# Patient Record
Sex: Male | Born: 1975 | Race: Black or African American | Hispanic: No | Marital: Married | Smoking: Never smoker
Health system: Southern US, Community
[De-identification: ages and names within clinical notes are randomized; demographics above are authoritative.]

## PROBLEM LIST (undated history)

## (undated) DIAGNOSIS — C189 Malignant neoplasm of colon, unspecified: Secondary | ICD-10-CM

## (undated) DIAGNOSIS — E119 Type 2 diabetes mellitus without complications: Secondary | ICD-10-CM

## (undated) DIAGNOSIS — R7303 Prediabetes: Secondary | ICD-10-CM

## (undated) HISTORY — PX: HERNIA REPAIR: SHX51

## (undated) HISTORY — DX: Type 2 diabetes mellitus without complications: E11.9

## (undated) HISTORY — PX: COLON SURGERY: SHX602

---

## 2008-05-20 ENCOUNTER — Encounter: Admission: RE | Admit: 2008-05-20 | Discharge: 2008-06-20 | Payer: Self-pay | Admitting: Orthopaedic Surgery

## 2008-08-04 ENCOUNTER — Emergency Department (HOSPITAL_COMMUNITY): Admission: EM | Admit: 2008-08-04 | Discharge: 2008-08-04 | Payer: Self-pay | Admitting: Emergency Medicine

## 2011-06-16 ENCOUNTER — Ambulatory Visit
Admission: RE | Admit: 2011-06-16 | Discharge: 2011-06-16 | Disposition: A | Discharge status: Home / Self Care | Payer: BC Managed Care – PPO | Source: Ambulatory Visit | Attending: Internal Medicine | Admitting: Internal Medicine

## 2011-06-16 ENCOUNTER — Other Ambulatory Visit: Payer: Self-pay | Admitting: Internal Medicine

## 2011-06-16 DIAGNOSIS — R52 Pain, unspecified: Secondary | ICD-10-CM

## 2011-06-21 ENCOUNTER — Other Ambulatory Visit: Payer: Self-pay | Admitting: Internal Medicine

## 2011-06-21 DIAGNOSIS — R52 Pain, unspecified: Secondary | ICD-10-CM

## 2011-06-24 ENCOUNTER — Ambulatory Visit
Admission: RE | Admit: 2011-06-24 | Discharge: 2011-06-24 | Disposition: A | Discharge status: Home / Self Care | Payer: BC Managed Care – PPO | Source: Ambulatory Visit | Attending: Internal Medicine | Admitting: Internal Medicine

## 2011-06-24 DIAGNOSIS — R52 Pain, unspecified: Secondary | ICD-10-CM

## 2013-04-28 IMAGING — CR DG ANKLE COMPLETE 3+V*L*
3 series · 3 of 3 positions shown · non-contrast
Comparison: None.

CLINICAL DATA: Medial left ankle pain for 3-4 weeks, no injury

LEFT ANKLE COMPLETE - 3+ VIEW

[t ankle joint ap left]
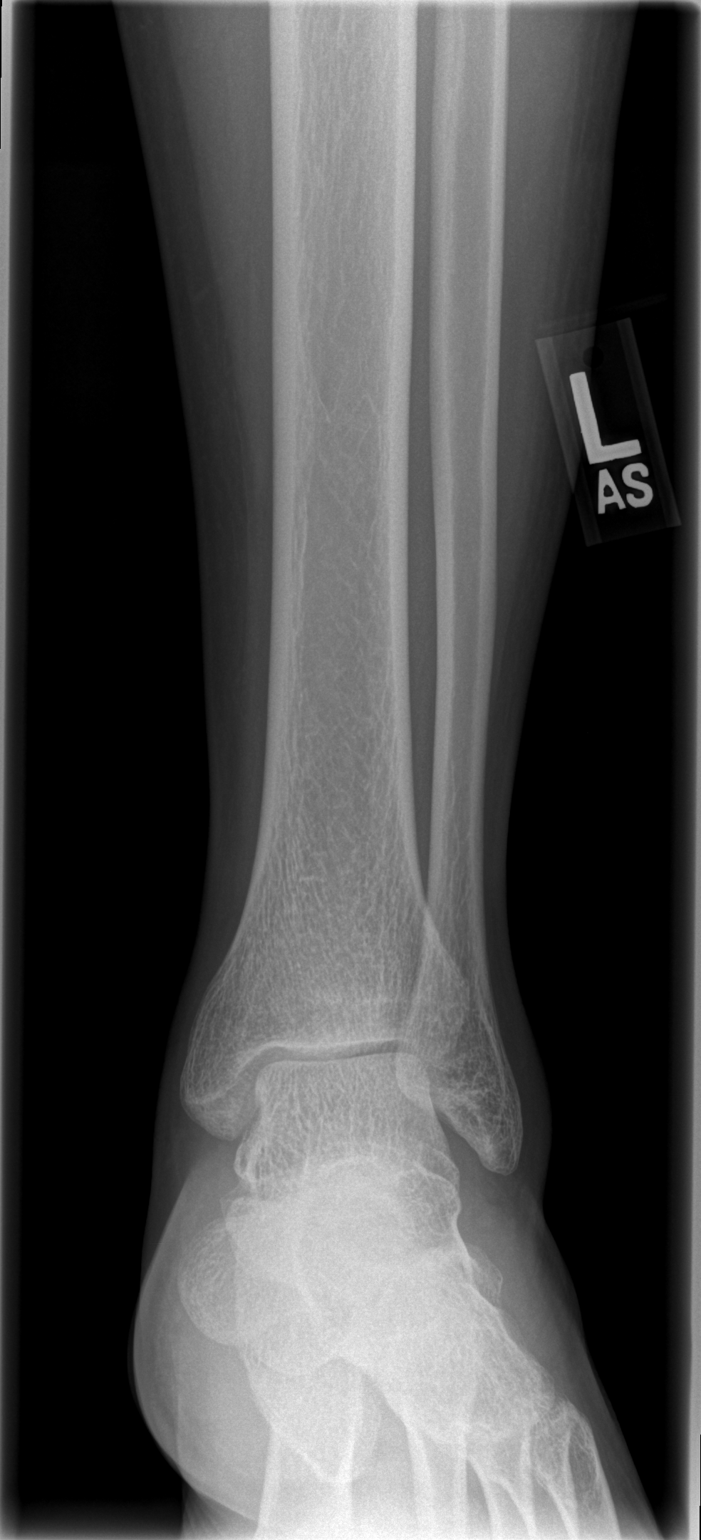

[t ankle joint oblique left]
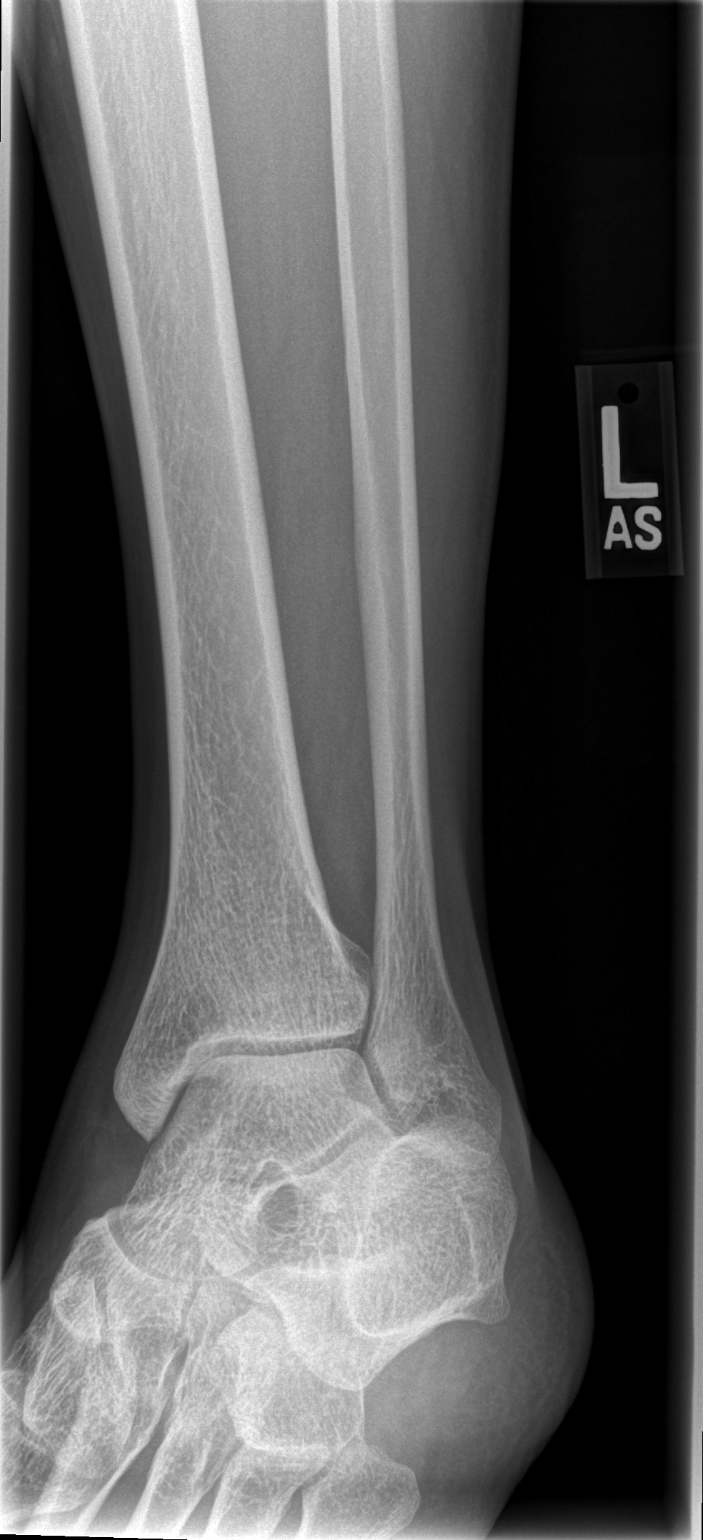

[t ankle joint lat left]
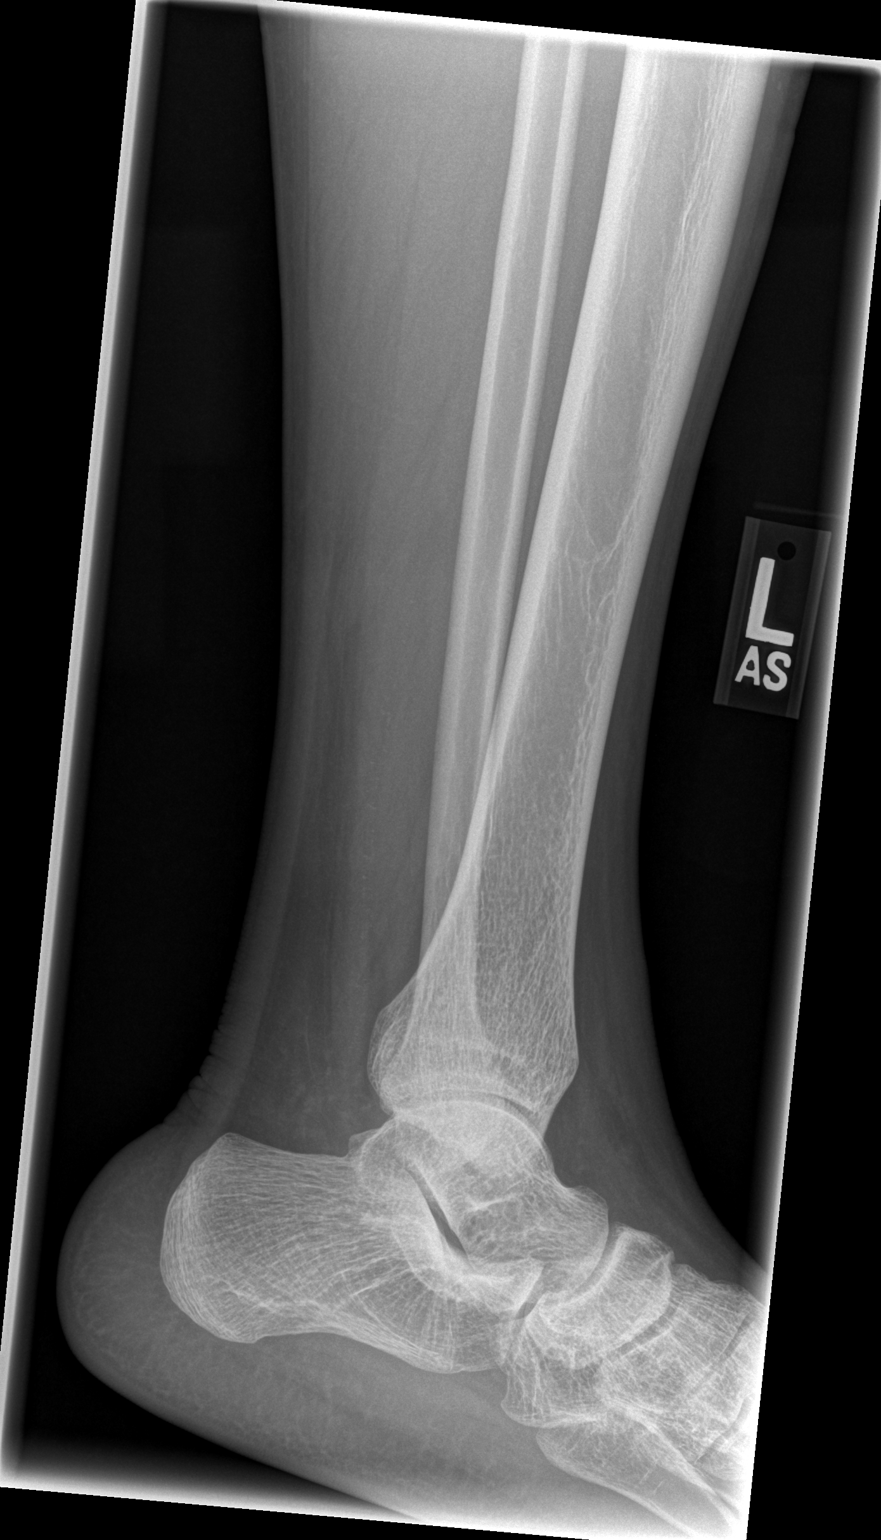

[3 of 3 positions shown; findings below may reference images not displayed]

FINDINGS: No acute abnormality is seen.  The ankle joint appears
normal.  Alignment is normal.
IMPRESSION: Negative.

## 2018-10-20 ENCOUNTER — Other Ambulatory Visit: Payer: Self-pay

## 2018-10-20 DIAGNOSIS — Z20822 Contact with and (suspected) exposure to covid-19: Secondary | ICD-10-CM

## 2018-10-21 LAB — NOVEL CORONAVIRUS, NAA: SARS-CoV-2, NAA: DETECTED — AB

## 2020-09-25 ENCOUNTER — Other Ambulatory Visit: Payer: Self-pay | Admitting: Gastroenterology

## 2020-09-25 ENCOUNTER — Emergency Department (HOSPITAL_COMMUNITY)
Admission: EM | Admit: 2020-09-25 | Discharge: 2020-09-26 | Disposition: A | Discharge status: Home / Self Care | Payer: BC Managed Care – PPO | Attending: Emergency Medicine | Admitting: Emergency Medicine

## 2020-09-25 ENCOUNTER — Other Ambulatory Visit (HOSPITAL_COMMUNITY): Payer: Self-pay | Admitting: Gastroenterology

## 2020-09-25 ENCOUNTER — Encounter (HOSPITAL_COMMUNITY): Payer: Self-pay

## 2020-09-25 ENCOUNTER — Ambulatory Visit (HOSPITAL_COMMUNITY): Payer: Self-pay

## 2020-09-25 ENCOUNTER — Emergency Department (HOSPITAL_COMMUNITY): Payer: BC Managed Care – PPO

## 2020-09-25 ENCOUNTER — Other Ambulatory Visit (HOSPITAL_BASED_OUTPATIENT_CLINIC_OR_DEPARTMENT_OTHER): Payer: Self-pay | Admitting: Gastroenterology

## 2020-09-25 ENCOUNTER — Other Ambulatory Visit: Payer: Self-pay

## 2020-09-25 DIAGNOSIS — K625 Hemorrhage of anus and rectum: Secondary | ICD-10-CM | POA: Diagnosis not present

## 2020-09-25 DIAGNOSIS — C187 Malignant neoplasm of sigmoid colon: Secondary | ICD-10-CM

## 2020-09-25 DIAGNOSIS — Z5321 Procedure and treatment not carried out due to patient leaving prior to being seen by health care provider: Secondary | ICD-10-CM | POA: Diagnosis not present

## 2020-09-25 DIAGNOSIS — R799 Abnormal finding of blood chemistry, unspecified: Secondary | ICD-10-CM | POA: Diagnosis not present

## 2020-09-25 DIAGNOSIS — C188 Malignant neoplasm of overlapping sites of colon: Secondary | ICD-10-CM | POA: Insufficient documentation

## 2020-09-25 LAB — COMPREHENSIVE METABOLIC PANEL
ALT: 22 U/L (ref 0–44)
AST: 20 U/L (ref 15–41)
Albumin: 4.8 g/dL (ref 3.5–5.0)
Alkaline Phosphatase: 60 U/L (ref 38–126)
Anion gap: 9 (ref 5–15)
BUN: 10 mg/dL (ref 6–20)
CO2: 26 mmol/L (ref 22–32)
Calcium: 9.3 mg/dL (ref 8.9–10.3)
Chloride: 102 mmol/L (ref 98–111)
Creatinine, Ser: 0.87 mg/dL (ref 0.61–1.24)
GFR, Estimated: >60 mL/min (ref >60)
Glucose, Bld: 91 mg/dL (ref 70–99)
Potassium: 3.7 mmol/L (ref 3.5–5.1)
Sodium: 137 mmol/L (ref 135–145)
Total Bilirubin: 0.8 mg/dL (ref 0.3–1.2)
Total Protein: 7.4 g/dL (ref 6.5–8.1)

## 2020-09-25 LAB — CBC WITH DIFFERENTIAL/PLATELET
Abs Immature Granulocytes: 0.02 10*3/uL (ref 0.00–0.07)
Basophils Absolute: 0 10*3/uL (ref 0.0–0.1)
Basophils Relative: 0 %
Eosinophils Absolute: 0 10*3/uL (ref 0.0–0.5)
Eosinophils Relative: 0 %
HCT: 42.8 % (ref 39.0–52.0)
Hemoglobin: 13.4 g/dL (ref 13.0–17.0)
Immature Granulocytes: 0 %
Lymphocytes Relative: 26 %
Lymphs Abs: 2.1 10*3/uL (ref 0.7–4.0)
MCH: 20.8 pg — ABNORMAL LOW (ref 26.0–34.0)
MCHC: 31.3 g/dL (ref 30.0–36.0)
MCV: 66.6 fL — ABNORMAL LOW (ref 80.0–100.0)
Monocytes Absolute: 0.6 10*3/uL (ref 0.1–1.0)
Monocytes Relative: 8 %
Neutro Abs: 5.3 10*3/uL (ref 1.7–7.7)
Neutrophils Relative %: 66 %
Platelets: 225 10*3/uL (ref 150–400)
RBC: 6.43 MIL/uL — ABNORMAL HIGH (ref 4.22–5.81)
RDW: 15.9 % — ABNORMAL HIGH (ref 11.5–15.5)
WBC: 8.1 10*3/uL (ref 4.0–10.5)
nRBC: 0 % (ref 0.0–0.2)

## 2020-09-25 MED ORDER — IOHEXOL 350 MG/ML SOLN
80.0000 mL | Freq: Once | INTRAVENOUS | Status: AC | PRN
  Administered 2020-09-25: 80 mL via INTRAVENOUS

## 2020-09-25 NOTE — ED Provider Notes (Signed)
Emergency Medicine Provider Triage Evaluation Note  Clifford [name] , a 45 y.o. male  was evaluated in triage.  Pt complains of bright red blood per rectum that began few weeks ago.  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  Presents here for CT chest abdomen and pelvis.  Review of Systems  Positive:  Negative: Weakness, numbness, fatigue, chest pain, shortness of breath  Physical Exam  BP (!) 152/101 (BP Location: Left Arm)   Pulse 79   Temp 98.5 F (36.9 C) (Oral)   Resp 18   Ht '5\' 4"'$  (1.626 m)   Wt 90.7 kg   SpO2 99%   BMI 34.33 kg/m  Gen:   Awake, no distress   Resp:  Normal effort  MSK:   Moves extremities without difficulty  Other:    Medical Decision Making  Medically screening exam initiated at 6:17 PM.  Appropriate orders placed.  Clifford [name] was informed that the remainder of the evaluation will be completed by another provider, this initial triage assessment does not replace that evaluation, and the importance of remaining in the ED until their evaluation is complete.     Myna Bright [place], PA-C 09/25/20 1819    Dorie Rank, MD 09/27/20 [phone]

## 2020-09-25 NOTE — ED Triage Notes (Signed)
Pt referred by doctor hong for CT Abd after colonoscopy today d/t suspicious colon cancer

## 2020-09-25 NOTE — ED Notes (Signed)
Pt ambulatory in ED lobby. 

## 2020-09-26 ENCOUNTER — Ambulatory Visit (HOSPITAL_COMMUNITY): Admission: RE | Admit: 2020-09-26 | Payer: BC Managed Care – PPO | Source: Ambulatory Visit

## 2020-09-26 NOTE — ED Notes (Signed)
Pt request IV be removed to leave.

## 2020-10-14 ENCOUNTER — Encounter: Payer: Self-pay | Admitting: Surgery

## 2020-10-14 DIAGNOSIS — C187 Malignant neoplasm of sigmoid colon: Principal | ICD-10-CM | POA: Diagnosis present

## 2020-10-14 DIAGNOSIS — K921 Melena: Secondary | ICD-10-CM | POA: Insufficient documentation

## 2020-10-14 DIAGNOSIS — R142 Eructation: Secondary | ICD-10-CM | POA: Insufficient documentation

## 2020-10-14 DIAGNOSIS — R141 Gas pain: Secondary | ICD-10-CM | POA: Insufficient documentation

## 2020-10-15 NOTE — Progress Notes (Signed)
Please place orders in epic pt. Has preop scheduled

## 2020-10-16 ENCOUNTER — Ambulatory Visit: Payer: Self-pay | Admitting: Surgery

## 2020-10-17 NOTE — Progress Notes (Signed)
PCP -  Cardiologist -   PPM/ICD -  Device Orders -  Rep Notified -   Chest x-ray - CT Chest 09-25-20 epic EKG -  Stress Test -  ECHO -  Cardiac Cath -  CBC/diff, CMP 09-25-20 epic  Sleep Study -  CPAP -   Fasting Blood Sugar -  Checks Blood Sugar _____ times a day  Blood Thinner Instructions: Aspirin Instructions:  ERAS Protcol - PRE-SURGERY Ensure or G2-   COVID TEST- 10-22-20 COVID vaccine -  Activity-- Anesthesia review: DM  Patient denies shortness of breath, fever, cough and chest pain at PAT appointment   All instructions explained to the patient, with a verbal understanding of the material. Patient agrees to go over the instructions while at home for a better understanding. Patient also instructed to self quarantine after being tested for COVID-19. The opportunity to ask questions was provided.

## 2020-10-17 NOTE — Patient Instructions (Addendum)
DUE TO COVID-19 ONLY ONE VISITOR IS ALLOWED TO COME WITH YOU AND STAY IN THE WAITING ROOM ONLY DURING PRE OP AND PROCEDURE.   **NO VISITORS ARE ALLOWED IN THE SHORT STAY AREA OR RECOVERY ROOM!!**  IF YOU WILL BE ADMITTED INTO THE HOSPITAL YOU ARE ALLOWED ONLY TWO SUPPORT PEOPLE DURING VISITATION HOURS ONLY (10AM -8PM)   The support person(s) may change daily. The support person(s) must pass our screening, gel in and out, and wear a mask at all times, including in the patient's room. Patients must also wear a mask when staff or their support person are in the room.  No visitors under the age of 64. Any visitor under the age of 42 must be accompanied by an adult.   COVID SWAB TESTING MUST BE COMPLETED ON:  10-22-20, Between the hours of 8 and 3  **MUST PRESENT COMPLETED FORM AT TESTING SITE**    [address] [place] [place] (backside of the building)  You are not required to quarantine, however you are required to wear a well-fitted mask when you are out and around people not in your household.  Hand Hygiene often Do NOT share personal items Notify your provider if you are in close contact with someone who has COVID or you develop fever 100.4 or greater, new onset of sneezing, cough, sore throat, shortness of breath or body aches.   Your procedure is scheduled on:  Friday, 10-24-20   Report to [facility] Main  Entrance    Report to admitting at 11:45 AM   Call this number if you have problems the morning of surgery [phone]   Follow a clear liquid diet day or prep to prevent dehydration   Do not eat food :After Midnight.   May have liquids until 11:00 AM day of surgery  CLEAR LIQUID DIET  Foods Allowed                                                                     Foods Excluded  Water, Black Coffee (no milk/no creamer) and tea, regular and decaf                              liquids that you cannot  Plain Jell-O in any flavor  (No red)                          see through such as: Fruit ices (not with fruit pulp)                                 milk, soups, orange juice  Iced Popsicles (No red)                                    All solid food                             Apple juices Sports drinks like Gatorade (No red) Lightly seasoned clear broth or consume(fat free)  Sugar Sample Menu Breakfast                                Lunch                                     Supper Cranberry juice                    Beef broth                            Chicken broth Jell-O                                     Grape juice                           Apple juice Coffee or tea                        Jell-O                                      Popsicle                                                Coffee or tea                        Coffee or tea       Drink 2 G2 drinks the night before surgery, complete by 10 PM.    Complete one G2 drink the morning of surgery 3 hours prior to scheduled surgery, complete by 11 AM.     The day of surgery:  Drink ONE (1)  G2 by am the morning of surgery. Drink in one sitting. Do not sip.  This drink was given to you during your hospital  pre-op appointment visit. Nothing else to drink after completing the  G2.          If you have questions, please contact your surgeon's office.     Oral Hygiene is also important to reduce your risk of infection.                                    Remember - BRUSH YOUR TEETH THE MORNING OF SURGERY WITH YOUR REGULAR TOOTHPASTE   Do NOT smoke after Midnight   Take these medicines the morning of surgery with A SIP OF WATER:  None  DO NOT TAKE ANY ORAL DIABETIC MEDICATIONS DAY OF YOUR SURGERY   Stop all vitamins and herbal supplements a week before surgery             You may not have any metal on your body including jewelry, and body piercing             Do not wear  lotions, powders, cologne, or deodorant  Men may shave face and neck.  Do not bring  valuables to the hospital. [place].   Contacts, dentures or bridgework may not be worn into surgery.   Bring small overnight bag day of surgery.   Special Instructions: Bring a copy of your healthcare power of attorney and living will documents the day of surgery if you haven't scanned them in before.  Please read over the following fact sheets you were given: IF YOU HAVE QUESTIONS ABOUT YOUR PRE OP INSTRUCTIONS PLEASE CALL [phone]           [facility] - Preparing for Surgery Before surgery, you can play an important role.  Because skin is not sterile, your skin needs to be as free of germs as possible.  You can reduce the number of germs on your skin by washing with CHG (chlorahexidine gluconate) soap before surgery.  CHG is an antiseptic cleaner which kills germs and bonds with the skin to continue killing germs even after washing. Please DO NOT use if you have an allergy to CHG or antibacterial soaps.  If your skin becomes reddened/irritated stop using the CHG and inform your nurse when you arrive at Short Stay. Do not shave (including legs and underarms) for at least 48 hours prior to the first CHG shower.  You may shave your face/neck. Please follow these instructions carefully:  1.  Shower with CHG Soap the night before surgery and the  morning of Surgery.  2.  If you choose to wash your hair, wash your hair first as usual with your  normal  shampoo.  3.  After you shampoo, rinse your hair and body thoroughly to remove the  shampoo.                           4.  Use CHG as you would any other liquid soap.  You can apply chg directly  to the skin and wash                       Gently with a scrungie or clean washcloth.  5.  Apply the CHG Soap to your body ONLY FROM THE NECK DOWN.   Do not use on face/ open                           Wound or open sores. Avoid contact with eyes, ears mouth and genitals (private parts).                       Wash  face,  Genitals (private parts) with your normal soap.             6.  Wash thoroughly, paying special attention to the area where your surgery  will be performed.  7.  Thoroughly rinse your body with warm water from the neck down.  8.  DO NOT shower/wash with your normal soap after using and rinsing off  the CHG Soap.                9.  Pat yourself dry with a clean towel.            10.  Wear clean pajamas.            11.  Place clean sheets on your bed the night of your first shower and do not  sleep with pets. Day of Surgery : Do not apply any lotions/deodorants the morning of surgery.  Please wear clean clothes to the hospital/surgery center.  FAILURE TO FOLLOW THESE INSTRUCTIONS MAY RESULT IN THE CANCELLATION OF YOUR SURGERY PATIENT SIGNATURE_________________________________  NURSE SIGNATURE__________________________________  ________________________________________________________________________    Adam Phenix  An incentive spirometer is a tool that can help keep your lungs clear and active. This tool measures how well you are filling your lungs with each breath. Taking long deep breaths may help reverse or decrease the chance of developing breathing (pulmonary) problems (especially infection) following: A long period of time when you are unable to move or be active. BEFORE THE PROCEDURE  If the spirometer includes an indicator to show your best effort, your nurse or respiratory therapist will set it to a desired goal. If possible, sit up straight or lean slightly forward. Try not to slouch. Hold the incentive spirometer in an upright position. INSTRUCTIONS FOR USE  Sit on the edge of your bed if possible, or sit up as far as you can in bed or on a chair. Hold the incentive spirometer in an upright position. Breathe out normally. Place the mouthpiece in your mouth and seal your lips tightly around it. Breathe in slowly and as deeply as possible, raising the piston or  the ball toward the top of the column. Hold your breath for 3-5 seconds or for as long as possible. Allow the piston or ball to fall to the bottom of the column. Remove the mouthpiece from your mouth and breathe out normally. Rest for a few seconds and repeat Steps 1 through 7 at least 10 times every 1-2 hours when you are awake. Take your time and take a few normal breaths between deep breaths. The spirometer may include an indicator to show your best effort. Use the indicator as a goal to work toward during each repetition. After each set of 10 deep breaths, practice coughing to be sure your lungs are clear. If you have an incision (the cut made at the time of surgery), support your incision when coughing by placing a pillow or rolled up towels firmly against it. Once you are able to get out of bed, walk around indoors and cough well. You may stop using the incentive spirometer when instructed by your caregiver.  RISKS AND COMPLICATIONS Take your time so you do not get dizzy or light-headed. If you are in pain, you may need to take or ask for pain medication before doing incentive spirometry. It is harder to take a deep breath if you are having pain. AFTER USE Rest and breathe slowly and easily. It can be helpful to keep track of a log of your progress. Your caregiver can provide you with a simple table to help with this. If you are using the spirometer at home, follow these instructions: [place] IF:  You are having difficultly using the spirometer. You have trouble using the spirometer as often as instructed. Your pain medication is not giving enough relief while using the spirometer. You develop fever of 100.5 F (38.1 C) or higher. SEEK IMMEDIATE MEDICAL CARE IF:  You cough up bloody sputum that had not been present before. You develop fever of 102 F (38.9 C) or greater. You develop worsening pain at or near the incision site. MAKE SURE YOU:  Understand these  instructions. Will watch your condition. Will get help right away if you are not doing well or get worse. Document Released: 05/17/2006 Document  Revised: 03/29/2011 Document Reviewed: 07/18/2006 Exit[employer] Patient Information 2014 [employer], [place].   ________________________________________________________________________    WHAT IS A BLOOD TRANSFUSION? Blood Transfusion Information  A transfusion is the replacement of blood or some of its parts. Blood is made up of multiple cells which provide different functions. Red blood cells carry oxygen and are used for blood loss replacement. White blood cells fight against infection. Platelets control bleeding. Plasma helps clot blood. Other blood products are available for specialized needs, such as hemophilia or other clotting disorders. BEFORE THE TRANSFUSION  Who gives blood for transfusions?  Healthy volunteers who are fully evaluated to make sure their blood is safe. This is blood bank blood. Transfusion therapy is the safest it has ever been in the practice of medicine. Before blood is taken from a donor, a complete history is taken to make sure that person has no history of diseases nor engages in risky social behavior (examples are intravenous drug use or sexual activity with multiple partners). The donor's travel history is screened to minimize risk of transmitting infections, such as malaria. The donated blood is tested for signs of infectious diseases, such as HIV and hepatitis. The blood is then tested to be sure it is compatible with you in order to minimize the chance of a transfusion reaction. If you or a relative donates blood, this is often done in anticipation of surgery and is not appropriate for emergency situations. It takes many days to process the donated blood. RISKS AND COMPLICATIONS Although transfusion therapy is very safe and saves many lives, the main dangers of transfusion include:  Getting an infectious  disease. Developing a transfusion reaction. This is an allergic reaction to something in the blood you were given. Every precaution is taken to prevent this. The decision to have a blood transfusion has been considered carefully by your caregiver before blood is given. Blood is not given unless the benefits outweigh the risks. AFTER THE TRANSFUSION Right after receiving a blood transfusion, you will usually feel much better and more energetic. This is especially true if your red blood cells have gotten low (anemic). The transfusion raises the level of the red blood cells which carry oxygen, and this usually causes an energy increase. The nurse administering the transfusion will monitor you carefully for complications. HOME CARE INSTRUCTIONS  No special instructions are needed after a transfusion. You may find your energy is better. Speak with your caregiver about any limitations on activity for underlying diseases you may have. SEEK MEDICAL CARE IF:  Your condition is not improving after your transfusion. You develop redness or irritation at the intravenous (IV) site. SEEK IMMEDIATE MEDICAL CARE IF:  Any of the following symptoms occur over the next 12 hours: Shaking chills. You have a temperature by mouth above 102 F (38.9 C), not controlled by medicine. Chest, back, or muscle pain. People around you feel you are not acting correctly or are confused. Shortness of breath or difficulty breathing. Dizziness and fainting. You get a rash or develop hives. You have a decrease in urine output. Your urine turns a dark color or changes to pink, red, or brown. Any of the following symptoms occur over the next 10 days: You have a temperature by mouth above 102 F (38.9 C), not controlled by medicine. Shortness of breath. Weakness after normal activity. The white part of the eye turns yellow (jaundice). You have a decrease in the amount of urine or are urinating less often. Your urine turns a  dark color  or changes to pink, red, or brown. Document Released: 01/02/2000 Document Revised: 03/29/2011 Document Reviewed: 08/21/2007 [facility] Patient Information 2014 [place], [place].  _______________________________________________________________________

## 2020-10-20 ENCOUNTER — Encounter (HOSPITAL_COMMUNITY): Payer: Self-pay

## 2020-10-20 ENCOUNTER — Encounter (HOSPITAL_COMMUNITY)
Admission: RE | Admit: 2020-10-20 | Discharge: 2020-10-20 | Disposition: A | Discharge status: Home / Self Care | Payer: BC Managed Care – PPO | Source: Ambulatory Visit | Attending: Surgery | Admitting: Surgery

## 2020-10-20 ENCOUNTER — Other Ambulatory Visit: Payer: Self-pay

## 2020-10-20 DIAGNOSIS — Z01818 Encounter for other preprocedural examination: Secondary | ICD-10-CM | POA: Diagnosis present

## 2020-10-20 HISTORY — DX: Malignant neoplasm of colon, unspecified: C18.9

## 2020-10-20 HISTORY — DX: Prediabetes: R73.03

## 2020-10-20 LAB — HEMOGLOBIN A1C
Hgb A1c MFr Bld: 6.6 % — ABNORMAL HIGH (ref 4.8–5.6)
Mean Plasma Glucose: 142.72 mg/dL

## 2020-10-20 LAB — GLUCOSE, CAPILLARY: Glucose-Capillary: 110 mg/dL — ABNORMAL HIGH (ref 70–99)

## 2020-10-20 NOTE — Progress Notes (Addendum)
PCP - Gala Romney, MD Cardiologist - N/A   Chest x-ray - CT Chest 09-25-20 epic EKG - 10-20-20 Epic Stress Test - N/A ECHO - N/A Cardiac Cath - N/A CBC/diff, CMP 09-25-20 epic   Sleep Study - N/A CPAP -    Fasting Blood Sugar - 105 to 130 Checks Blood Sugar x1 time a day   Blood Thinner Instructions: N/A Aspirin Instructions:   COVID TEST- 10-22-20 COVID vaccine - Yes x2. Moderna   Activity-- Patient able to climb a flight of stairs and perform ADLs without chest pain or SOB.    Anesthesia review:N/A   Patient denies shortness of breath, fever, cough and chest pain at PAT appointment     All instructions explained to the patient, with a verbal understanding of the material. Patient agrees to go over the instructions while at home for a better understanding. Patient also instructed to self quarantine after being tested for COVID-19. The opportunity to ask questions was provided.

## 2020-10-22 ENCOUNTER — Other Ambulatory Visit: Payer: Self-pay | Admitting: Surgery

## 2020-10-23 LAB — SARS CORONAVIRUS 2 (TAT 6-24 HRS): SARS Coronavirus 2: NEGATIVE

## 2020-10-24 ENCOUNTER — Encounter (HOSPITAL_COMMUNITY)
Admission: RE | Disposition: A | Discharge status: Home / Self Care | Payer: Self-pay | Source: Home / Self Care | Attending: Surgery

## 2020-10-24 ENCOUNTER — Encounter: Payer: Self-pay | Admitting: Surgery

## 2020-10-24 ENCOUNTER — Encounter (HOSPITAL_COMMUNITY): Payer: Self-pay | Admitting: Surgery

## 2020-10-24 ENCOUNTER — Inpatient Hospital Stay (HOSPITAL_COMMUNITY): Payer: BC Managed Care – PPO | Admitting: Certified Registered"

## 2020-10-24 ENCOUNTER — Inpatient Hospital Stay (HOSPITAL_COMMUNITY)
Admission: RE | Admit: 2020-10-24 | Discharge: 2020-10-26 | DRG: 330 | Disposition: A | Discharge status: Home / Self Care | Payer: BC Managed Care – PPO | Attending: Surgery | Admitting: Surgery

## 2020-10-24 ENCOUNTER — Other Ambulatory Visit: Payer: Self-pay

## 2020-10-24 DIAGNOSIS — E119 Type 2 diabetes mellitus without complications: Secondary | ICD-10-CM | POA: Diagnosis present

## 2020-10-24 DIAGNOSIS — C772 Secondary and unspecified malignant neoplasm of intra-abdominal lymph nodes: Secondary | ICD-10-CM | POA: Diagnosis present

## 2020-10-24 DIAGNOSIS — C19 Malignant neoplasm of rectosigmoid junction: Secondary | ICD-10-CM | POA: Diagnosis present

## 2020-10-24 DIAGNOSIS — Z886 Allergy status to analgesic agent status: Secondary | ICD-10-CM

## 2020-10-24 DIAGNOSIS — Z7984 Long term (current) use of oral hypoglycemic drugs: Secondary | ICD-10-CM

## 2020-10-24 DIAGNOSIS — R7303 Prediabetes: Secondary | ICD-10-CM | POA: Diagnosis present

## 2020-10-24 DIAGNOSIS — C187 Malignant neoplasm of sigmoid colon: Secondary | ICD-10-CM

## 2020-10-24 DIAGNOSIS — K219 Gastro-esophageal reflux disease without esophagitis: Secondary | ICD-10-CM | POA: Diagnosis present

## 2020-10-24 HISTORY — PX: PROCTOSCOPY: SHX2266

## 2020-10-24 LAB — TYPE AND SCREEN
ABO/RH(D): O POS
Antibody Screen: NEGATIVE

## 2020-10-24 LAB — GLUCOSE, CAPILLARY: Glucose-Capillary: 138 mg/dL — ABNORMAL HIGH (ref 70–99)

## 2020-10-24 LAB — ABO/RH: ABO/RH(D): O POS

## 2020-10-24 SURGERY — COLECTOMY, SIGMOID, ROBOT-ASSISTED
Anesthesia: General | Site: Rectum

## 2020-10-24 MED ORDER — SODIUM CHLORIDE 0.9 % IV SOLN
2.0000 g | INTRAVENOUS | Status: DC
  Filled 2020-10-24: qty 2

## 2020-10-24 MED ORDER — POLYETHYLENE GLYCOL 3350 17 GM/SCOOP PO POWD: 1.0000 | Freq: Once | ORAL | Status: DC

## 2020-10-24 MED ORDER — LACTATED RINGERS IV BOLUS: 1000.0000 mL | Freq: Three times a day (TID) | INTRAVENOUS | Status: DC | PRN

## 2020-10-24 MED ORDER — ESMOLOL HCL 100 MG/10ML IV SOLN
INTRAVENOUS | Status: DC | PRN
  Administered 2020-10-24 (×3): 20 mg via INTRAVENOUS

## 2020-10-24 MED ORDER — PHENYLEPHRINE HCL-NACL 20-0.9 MG/250ML-% IV SOLN
INTRAVENOUS | Status: DC | PRN
  Administered 2020-10-24: 20 ug/min via INTRAVENOUS

## 2020-10-24 MED ORDER — DIPHENHYDRAMINE HCL 50 MG/ML IJ SOLN: 12.5000 mg | Freq: Four times a day (QID) | INTRAMUSCULAR | Status: DC | PRN

## 2020-10-24 MED ORDER — ENOXAPARIN SODIUM 40 MG/0.4ML IJ SOSY
40.0000 mg | PREFILLED_SYRINGE | Freq: Once | INTRAMUSCULAR | Status: AC
  Administered 2020-10-24: 40 mg via SUBCUTANEOUS
  Filled 2020-10-24: qty 0.4

## 2020-10-24 MED ORDER — PROPOFOL 10 MG/ML IV BOLUS
INTRAVENOUS | Status: DC | PRN
  Administered 2020-10-24: 160 mg via INTRAVENOUS

## 2020-10-24 MED ORDER — SUGAMMADEX SODIUM 200 MG/2ML IV SOLN
INTRAVENOUS | Status: DC | PRN
  Administered 2020-10-24: 200 mg via INTRAVENOUS

## 2020-10-24 MED ORDER — ENSURE PRE-SURGERY PO LIQD
296.0000 mL | Freq: Once | ORAL | Status: DC
  Administered 2020-10-24: 296 mL via ORAL
  Filled 2020-10-24: qty 296

## 2020-10-24 MED ORDER — ALVIMOPAN 12 MG PO CAPS
12.0000 mg | ORAL_CAPSULE | ORAL | Status: AC
  Administered 2020-10-24: 12 mg via ORAL
  Filled 2020-10-24: qty 1

## 2020-10-24 MED ORDER — ENOXAPARIN SODIUM 40 MG/0.4ML IJ SOSY
40.0000 mg | PREFILLED_SYRINGE | INTRAMUSCULAR | Status: DC
  Administered 2020-10-25 – 2020-10-26 (×2): 40 mg via SUBCUTANEOUS
  Filled 2020-10-24 (×2): qty 0.4

## 2020-10-24 MED ORDER — SODIUM CHLORIDE 0.9 % IV SOLN: Freq: Three times a day (TID) | INTRAVENOUS | Status: DC | PRN

## 2020-10-24 MED ORDER — ENALAPRILAT 1.25 MG/ML IV SOLN
0.6250 mg | Freq: Four times a day (QID) | INTRAVENOUS | Status: DC | PRN
  Filled 2020-10-24: qty 1

## 2020-10-24 MED ORDER — FENTANYL CITRATE (PF) 100 MCG/2ML IJ SOLN
INTRAMUSCULAR | Status: DC | PRN
  Administered 2020-10-24: 50 ug via INTRAVENOUS
  Administered 2020-10-24 (×3): 25 ug via INTRAVENOUS
  Administered 2020-10-24: 75 ug via INTRAVENOUS

## 2020-10-24 MED ORDER — HYDROMORPHONE HCL 1 MG/ML IJ SOLN: 0.5000 mg | INTRAMUSCULAR | Status: DC | PRN

## 2020-10-24 MED ORDER — TRAMADOL HCL 50 MG PO TABS: 50.0000 mg | ORAL_TABLET | Freq: Four times a day (QID) | ORAL | 0 refills | Status: DC | PRN

## 2020-10-24 MED ORDER — LACTATED RINGERS IV SOLN: INTRAVENOUS | Status: DC

## 2020-10-24 MED ORDER — ONDANSETRON HCL 4 MG/2ML IJ SOLN
INTRAMUSCULAR | Status: DC | PRN
  Administered 2020-10-24: 4 mg via INTRAVENOUS

## 2020-10-24 MED ORDER — CELECOXIB 200 MG PO CAPS
200.0000 mg | ORAL_CAPSULE | ORAL | Status: AC
  Administered 2020-10-24: 200 mg via ORAL
  Filled 2020-10-24: qty 1

## 2020-10-24 MED ORDER — INSULIN ASPART 100 UNIT/ML IJ SOLN: 0.0000 [IU] | Freq: Every day | INTRAMUSCULAR | Status: DC

## 2020-10-24 MED ORDER — METHOCARBAMOL 500 MG PO TABS: 1000.0000 mg | ORAL_TABLET | Freq: Four times a day (QID) | ORAL | Status: DC | PRN

## 2020-10-24 MED ORDER — INSULIN ASPART 100 UNIT/ML IJ SOLN: 0.0000 [IU] | Freq: Three times a day (TID) | INTRAMUSCULAR | Status: DC

## 2020-10-24 MED ORDER — METHOCARBAMOL 1000 MG/10ML IJ SOLN
1000.0000 mg | Freq: Four times a day (QID) | INTRAVENOUS | Status: DC | PRN
  Filled 2020-10-24: qty 10

## 2020-10-24 MED ORDER — PROMETHAZINE HCL 25 MG/ML IJ SOLN: 6.2500 mg | INTRAMUSCULAR | Status: DC | PRN

## 2020-10-24 MED ORDER — ACETAMINOPHEN 160 MG/5ML PO SOLN: 325.0000 mg | ORAL | Status: DC | PRN

## 2020-10-24 MED ORDER — SIMETHICONE 80 MG PO CHEW: 40.0000 mg | CHEWABLE_TABLET | Freq: Four times a day (QID) | ORAL | Status: DC | PRN

## 2020-10-24 MED ORDER — ACETAMINOPHEN 10 MG/ML IV SOLN: 1000.0000 mg | Freq: Once | INTRAVENOUS | Status: DC | PRN

## 2020-10-24 MED ORDER — FENTANYL CITRATE (PF) 250 MCG/5ML IJ SOLN
INTRAMUSCULAR | Status: AC
  Filled 2020-10-24: qty 5

## 2020-10-24 MED ORDER — ESMOLOL HCL 100 MG/10ML IV SOLN
INTRAVENOUS | Status: AC
  Filled 2020-10-24: qty 10

## 2020-10-24 MED ORDER — NEOMYCIN SULFATE 500 MG PO TABS: 1000.0000 mg | ORAL_TABLET | ORAL | Status: DC

## 2020-10-24 MED ORDER — ONDANSETRON HCL 4 MG PO TABS: 4.0000 mg | ORAL_TABLET | Freq: Four times a day (QID) | ORAL | Status: DC | PRN

## 2020-10-24 MED ORDER — SODIUM CHLORIDE 0.9 % IV SOLN
2.0000 g | INTRAVENOUS | Status: AC
  Administered 2020-10-24: 2 g via INTRAVENOUS

## 2020-10-24 MED ORDER — PROPOFOL 10 MG/ML IV BOLUS
INTRAVENOUS | Status: AC
  Filled 2020-10-24: qty 20

## 2020-10-24 MED ORDER — GABAPENTIN 300 MG PO CAPS: 300.0000 mg | ORAL_CAPSULE | ORAL | Status: DC

## 2020-10-24 MED ORDER — BUPIVACAINE-EPINEPHRINE (PF) 0.25% -1:200000 IJ SOLN
INTRAMUSCULAR | Status: DC | PRN
  Administered 2020-10-24: 60 mL

## 2020-10-24 MED ORDER — LIDOCAINE HCL (PF) 2 % IJ SOLN
INTRAMUSCULAR | Status: DC | PRN
Start: 2020-10-24 — End: 2020-10-24
  Administered 2020-10-24: 1.5 mg/kg/h via INTRADERMAL

## 2020-10-24 MED ORDER — BISACODYL 5 MG PO TBEC: 20.0000 mg | DELAYED_RELEASE_TABLET | Freq: Once | ORAL | Status: DC

## 2020-10-24 MED ORDER — BUPIVACAINE LIPOSOME 1.3 % IJ SUSP
INTRAMUSCULAR | Status: AC
  Filled 2020-10-24: qty 20

## 2020-10-24 MED ORDER — CALCIUM POLYCARBOPHIL 625 MG PO TABS
625.0000 mg | ORAL_TABLET | Freq: Two times a day (BID) | ORAL | Status: DC
  Administered 2020-10-24 – 2020-10-25 (×3): 625 mg via ORAL
  Filled 2020-10-24 (×3): qty 1

## 2020-10-24 MED ORDER — ROCURONIUM BROMIDE 10 MG/ML (PF) SYRINGE
PREFILLED_SYRINGE | INTRAVENOUS | Status: AC
  Filled 2020-10-24: qty 10

## 2020-10-24 MED ORDER — ACETAMINOPHEN 500 MG PO TABS
1000.0000 mg | ORAL_TABLET | Freq: Four times a day (QID) | ORAL | Status: DC
  Administered 2020-10-24 – 2020-10-26 (×5): 1000 mg via ORAL
  Filled 2020-10-24 (×5): qty 2

## 2020-10-24 MED ORDER — METRONIDAZOLE 500 MG PO TABS: 1000.0000 mg | ORAL_TABLET | ORAL | Status: DC

## 2020-10-24 MED ORDER — CHLORHEXIDINE GLUCONATE 0.12 % MT SOLN
15.0000 mL | Freq: Once | OROMUCOSAL | Status: AC
  Administered 2020-10-24: 15 mL via OROMUCOSAL

## 2020-10-24 MED ORDER — MIDAZOLAM HCL 5 MG/5ML IJ SOLN
INTRAMUSCULAR | Status: DC | PRN
Start: 2020-10-24 — End: 2020-10-24
  Administered 2020-10-24: 2 mg via INTRAVENOUS

## 2020-10-24 MED ORDER — LIDOCAINE 2% (20 MG/ML) 5 ML SYRINGE
INTRAMUSCULAR | Status: DC | PRN
  Administered 2020-10-24: 40 mg via INTRAVENOUS

## 2020-10-24 MED ORDER — LIDOCAINE HCL (PF) 2 % IJ SOLN
INTRAMUSCULAR | Status: AC
  Filled 2020-10-24: qty 30

## 2020-10-24 MED ORDER — BUPIVACAINE LIPOSOME 1.3 % IJ SUSP
INTRAMUSCULAR | Status: DC | PRN
  Administered 2020-10-24: 20 mL

## 2020-10-24 MED ORDER — ACETAMINOPHEN 500 MG PO TABS
1000.0000 mg | ORAL_TABLET | ORAL | Status: AC
  Administered 2020-10-24: 1000 mg via ORAL
  Filled 2020-10-24: qty 2

## 2020-10-24 MED ORDER — PROCHLORPERAZINE EDISYLATE 10 MG/2ML IJ SOLN: 5.0000 mg | Freq: Four times a day (QID) | INTRAMUSCULAR | Status: DC | PRN

## 2020-10-24 MED ORDER — LACTATED RINGERS IR SOLN
Status: DC | PRN
  Administered 2020-10-24: 1000 mL

## 2020-10-24 MED ORDER — ROCURONIUM BROMIDE 10 MG/ML (PF) SYRINGE
PREFILLED_SYRINGE | INTRAVENOUS | Status: DC | PRN
  Administered 2020-10-24: 60 mg via INTRAVENOUS
  Administered 2020-10-24: 40 mg via INTRAVENOUS

## 2020-10-24 MED ORDER — KETAMINE HCL 10 MG/ML IJ SOLN
INTRAMUSCULAR | Status: AC
  Filled 2020-10-24: qty 1

## 2020-10-24 MED ORDER — ENSURE PRE-SURGERY PO LIQD
592.0000 mL | Freq: Once | ORAL | Status: DC
  Filled 2020-10-24: qty 592

## 2020-10-24 MED ORDER — DIPHENHYDRAMINE HCL 12.5 MG/5ML PO ELIX: 12.5000 mg | ORAL_SOLUTION | Freq: Four times a day (QID) | ORAL | Status: DC | PRN

## 2020-10-24 MED ORDER — LIP MEDEX EX OINT
1.0000 "application " | TOPICAL_OINTMENT | Freq: Two times a day (BID) | CUTANEOUS | Status: DC
  Administered 2020-10-24 – 2020-10-25 (×3): 1 via TOPICAL
  Filled 2020-10-24: qty 7

## 2020-10-24 MED ORDER — DEXAMETHASONE SODIUM PHOSPHATE 10 MG/ML IJ SOLN
INTRAMUSCULAR | Status: DC | PRN
  Administered 2020-10-24: 8 mg via INTRAVENOUS

## 2020-10-24 MED ORDER — BUPIVACAINE-EPINEPHRINE (PF) 0.25% -1:200000 IJ SOLN
INTRAMUSCULAR | Status: AC
  Filled 2020-10-24: qty 60

## 2020-10-24 MED ORDER — ACETAMINOPHEN 325 MG PO TABS: 325.0000 mg | ORAL_TABLET | ORAL | Status: DC | PRN

## 2020-10-24 MED ORDER — BUPIVACAINE LIPOSOME 1.3 % IJ SUSP: 20.0000 mL | Freq: Once | INTRAMUSCULAR | Status: DC

## 2020-10-24 MED ORDER — OXYCODONE HCL 5 MG/5ML PO SOLN: 5.0000 mg | Freq: Once | ORAL | Status: DC | PRN

## 2020-10-24 MED ORDER — METOPROLOL TARTRATE 5 MG/5ML IV SOLN: 5.0000 mg | Freq: Four times a day (QID) | INTRAVENOUS | Status: DC | PRN

## 2020-10-24 MED ORDER — ACETAMINOPHEN 500 MG PO TABS: 1000.0000 mg | ORAL_TABLET | ORAL | Status: DC

## 2020-10-24 MED ORDER — SODIUM CHLORIDE 0.9 % IV SOLN
2.0000 g | Freq: Two times a day (BID) | INTRAVENOUS | Status: AC
  Administered 2020-10-25: 2 g via INTRAVENOUS
  Filled 2020-10-24: qty 2

## 2020-10-24 MED ORDER — PROCHLORPERAZINE MALEATE 10 MG PO TABS
10.0000 mg | ORAL_TABLET | Freq: Four times a day (QID) | ORAL | Status: DC | PRN
  Filled 2020-10-24: qty 1

## 2020-10-24 MED ORDER — KETAMINE HCL 10 MG/ML IJ SOLN
INTRAMUSCULAR | Status: DC | PRN
  Administered 2020-10-24: 10 mg via INTRAVENOUS
  Administered 2020-10-24: 40 mg via INTRAVENOUS

## 2020-10-24 MED ORDER — 0.9 % SODIUM CHLORIDE (POUR BTL) OPTIME
TOPICAL | Status: DC | PRN
  Administered 2020-10-24 (×2): 1000 mL

## 2020-10-24 MED ORDER — ALUM & MAG HYDROXIDE-SIMETH 200-200-20 MG/5ML PO SUSP: 30.0000 mL | Freq: Four times a day (QID) | ORAL | Status: DC | PRN

## 2020-10-24 MED ORDER — MIDAZOLAM HCL 2 MG/2ML IJ SOLN
INTRAMUSCULAR | Status: AC
  Filled 2020-10-24: qty 2

## 2020-10-24 MED ORDER — FENTANYL CITRATE PF 50 MCG/ML IJ SOSY: 25.0000 ug | PREFILLED_SYRINGE | INTRAMUSCULAR | Status: DC | PRN

## 2020-10-24 MED ORDER — ORAL CARE MOUTH RINSE: 15.0000 mL | Freq: Once | OROMUCOSAL | Status: AC

## 2020-10-24 MED ORDER — OXYCODONE HCL 5 MG PO TABS: 5.0000 mg | ORAL_TABLET | Freq: Once | ORAL | Status: DC | PRN

## 2020-10-24 MED ORDER — MAGIC MOUTHWASH
15.0000 mL | Freq: Four times a day (QID) | ORAL | Status: DC | PRN
  Filled 2020-10-24: qty 15

## 2020-10-24 MED ORDER — ENSURE SURGERY PO LIQD
237.0000 mL | Freq: Two times a day (BID) | ORAL | Status: DC
  Administered 2020-10-25 (×2): 237 mL via ORAL

## 2020-10-24 MED ORDER — ENSURE PRE-SURGERY PO LIQD
296.0000 mL | Freq: Once | ORAL | Status: DC
  Filled 2020-10-24: qty 296

## 2020-10-24 MED ORDER — MELATONIN 3 MG PO TABS: 3.0000 mg | ORAL_TABLET | Freq: Every evening | ORAL | Status: DC | PRN

## 2020-10-24 MED ORDER — TRAMADOL HCL 50 MG PO TABS: 50.0000 mg | ORAL_TABLET | Freq: Four times a day (QID) | ORAL | Status: DC | PRN

## 2020-10-24 MED ORDER — ALVIMOPAN 12 MG PO CAPS
12.0000 mg | ORAL_CAPSULE | Freq: Two times a day (BID) | ORAL | Status: DC
  Filled 2020-10-24: qty 1

## 2020-10-24 MED ORDER — ONDANSETRON HCL 4 MG/2ML IJ SOLN: 4.0000 mg | Freq: Four times a day (QID) | INTRAMUSCULAR | Status: DC | PRN

## 2020-10-24 MED ORDER — GABAPENTIN 300 MG PO CAPS
300.0000 mg | ORAL_CAPSULE | ORAL | Status: AC
  Administered 2020-10-24: 300 mg via ORAL
  Filled 2020-10-24: qty 1

## 2020-10-24 MED ORDER — SUCCINYLCHOLINE CHLORIDE 200 MG/10ML IV SOSY
PREFILLED_SYRINGE | INTRAVENOUS | Status: AC
  Filled 2020-10-24: qty 10

## 2020-10-24 MED ORDER — AMISULPRIDE (ANTIEMETIC) 5 MG/2ML IV SOLN
10.0000 mg | Freq: Once | INTRAVENOUS | Status: DC | PRN
Start: 2020-10-24 — End: 2020-10-24

## 2020-10-24 SURGICAL SUPPLY — 108 items
APPLIER CLIP 5 13 M/L LIGAMAX5 (MISCELLANEOUS)
APPLIER CLIP ROT 10 11.4 M/L (STAPLE)
BAG COUNTER SPONGE SURGICOUNT (BAG) ×3 IMPLANT
BLADE EXTENDED COATED 6.5IN (ELECTRODE) IMPLANT
CANNULA REDUC XI 12-8 STAPL (CANNULA) ×1
CANNULA REDUCER 12-8 DVNC XI (CANNULA) ×2 IMPLANT
CATH FOLEY SILVER 30CC 26FR (CATHETERS) ×3 IMPLANT
CELLS DAT CNTRL 66122 CELL SVR (MISCELLANEOUS) IMPLANT
CHLORAPREP W/TINT 26 (MISCELLANEOUS) ×3 IMPLANT
CLIP APPLIE 5 13 M/L LIGAMAX5 (MISCELLANEOUS) IMPLANT
CLIP APPLIE ROT 10 11.4 M/L (STAPLE) IMPLANT
COVER SURGICAL LIGHT HANDLE (MISCELLANEOUS) ×6 IMPLANT
COVER TIP SHEARS 8 DVNC (MISCELLANEOUS) ×2 IMPLANT
COVER TIP SHEARS 8MM DA VINCI (MISCELLANEOUS) ×1
DECANTER SPIKE VIAL GLASS SM (MISCELLANEOUS) ×6 IMPLANT
DEVICE TROCAR PUNCTURE CLOSURE (ENDOMECHANICALS) IMPLANT
DRAIN CHANNEL 19F RND (DRAIN) IMPLANT
DRAPE ARM DVNC X/XI (DISPOSABLE) ×8 IMPLANT
DRAPE COLUMN DVNC XI (DISPOSABLE) ×2 IMPLANT
DRAPE DA VINCI XI ARM (DISPOSABLE) ×4
DRAPE DA VINCI XI COLUMN (DISPOSABLE) ×1
DRAPE SURG IRRIG POUCH 19X23 (DRAPES) ×3 IMPLANT
DRSG OPSITE POSTOP 3X4 (GAUZE/BANDAGES/DRESSINGS) ×3 IMPLANT
DRSG OPSITE POSTOP 4X10 (GAUZE/BANDAGES/DRESSINGS) IMPLANT
DRSG OPSITE POSTOP 4X6 (GAUZE/BANDAGES/DRESSINGS) IMPLANT
DRSG OPSITE POSTOP 4X8 (GAUZE/BANDAGES/DRESSINGS) IMPLANT
DRSG TEGADERM 2-3/8X2-3/4 SM (GAUZE/BANDAGES/DRESSINGS) IMPLANT
DRSG TEGADERM 4X4.75 (GAUZE/BANDAGES/DRESSINGS) IMPLANT
ELECT PENCIL ROCKER SW 15FT (MISCELLANEOUS) ×3 IMPLANT
ELECT REM PT RETURN 15FT ADLT (MISCELLANEOUS) ×3 IMPLANT
ENDOLOOP SUT PDS II  0 18 (SUTURE)
ENDOLOOP SUT PDS II 0 18 (SUTURE) IMPLANT
EVACUATOR SILICONE 100CC (DRAIN) IMPLANT
GAUZE SPONGE 2X2 8PLY STRL LF (GAUZE/BANDAGES/DRESSINGS) ×2 IMPLANT
GLOVE SURG NEOPR MICRO LF SZ8 (GLOVE) ×9 IMPLANT
GLOVE SURG UNDER LTX SZ8 (GLOVE) ×9 IMPLANT
GOWN STRL REUS W/TWL XL LVL3 (GOWN DISPOSABLE) ×9 IMPLANT
GRASPER SUT TROCAR 14GX15 (MISCELLANEOUS) IMPLANT
HOLDER FOLEY CATH W/STRAP (MISCELLANEOUS) ×3 IMPLANT
IRRIG SUCT STRYKERFLOW 2 WTIP (MISCELLANEOUS) ×3
IRRIGATION SUCT STRKRFLW 2 WTP (MISCELLANEOUS) ×2 IMPLANT
KIT PROCEDURE DA VINCI SI (MISCELLANEOUS) ×1
KIT PROCEDURE DVNC SI (MISCELLANEOUS) ×2 IMPLANT
KIT SIGMOIDOSCOPE (SET/KITS/TRAYS/PACK) IMPLANT
KIT TURNOVER KIT A (KITS) ×3 IMPLANT
NEEDLE INSUFFLATION 14GA 120MM (NEEDLE) ×3 IMPLANT
PACK CARDIOVASCULAR III (CUSTOM PROCEDURE TRAY) ×3 IMPLANT
PACK COLON (CUSTOM PROCEDURE TRAY) ×3 IMPLANT
PAD POSITIONING PINK XL (MISCELLANEOUS) ×3 IMPLANT
PROTECTOR NERVE ULNAR (MISCELLANEOUS) ×6 IMPLANT
RELOAD STAPLER 3.5X45 BLU DVNC (STAPLE) IMPLANT
RELOAD STAPLER 3.5X60 BLU DVNC (STAPLE) IMPLANT
RELOAD STAPLER 4.3X45 GRN DVNC (STAPLE) IMPLANT
RELOAD STAPLER 4.3X60 GRN DVNC (STAPLE) ×4 IMPLANT
RTRCTR WOUND ALEXIS 18CM MED (MISCELLANEOUS)
SCISSORS LAP 5X35 DISP (ENDOMECHANICALS) ×3 IMPLANT
SEAL CANN UNIV 5-8 DVNC XI (MISCELLANEOUS) ×6 IMPLANT
SEAL XI 5MM-8MM UNIVERSAL (MISCELLANEOUS) ×3
SEALER VESSEL DA VINCI XI (MISCELLANEOUS) ×1
SEALER VESSEL EXT DVNC XI (MISCELLANEOUS) ×2 IMPLANT
SOLUTION ELECTROLUBE (MISCELLANEOUS) ×3 IMPLANT
SPONGE GAUZE 2X2 STER 10/PKG (GAUZE/BANDAGES/DRESSINGS) ×1
STAPLER 45 DA VINCI SURE FORM (STAPLE)
STAPLER 45 SUREFORM DVNC (STAPLE) IMPLANT
STAPLER 60 DA VINCI SURE FORM (STAPLE) ×2
STAPLER 60 SUREFORM DVNC (STAPLE) ×4 IMPLANT
STAPLER CANNULA SEAL DVNC XI (STAPLE) ×2 IMPLANT
STAPLER CANNULA SEAL XI (STAPLE) ×1
STAPLER ECHELON POWER CIR 29 (STAPLE) IMPLANT
STAPLER ECHELON POWER CIR 31 (STAPLE) IMPLANT
STAPLER RELOAD 3.5X45 BLU DVNC (STAPLE)
STAPLER RELOAD 3.5X45 BLUE (STAPLE)
STAPLER RELOAD 3.5X60 BLU DVNC (STAPLE)
STAPLER RELOAD 3.5X60 BLUE (STAPLE)
STAPLER RELOAD 4.3X45 GREEN (STAPLE)
STAPLER RELOAD 4.3X45 GRN DVNC (STAPLE)
STAPLER RELOAD 4.3X60 GREEN (STAPLE) ×2
STAPLER RELOAD 4.3X60 GRN DVNC (STAPLE) ×4
STOPCOCK 4 WAY LG BORE MALE ST (IV SETS) ×6 IMPLANT
SURGILUBE 2OZ TUBE FLIPTOP (MISCELLANEOUS) ×3 IMPLANT
SUT MNCRL AB 4-0 PS2 18 (SUTURE) ×3 IMPLANT
SUT PDS AB 1 CT1 27 (SUTURE) ×6 IMPLANT
SUT PROLENE 0 CT 2 (SUTURE) IMPLANT
SUT PROLENE 2 0 KS (SUTURE) IMPLANT
SUT PROLENE 2 0 SH DA (SUTURE) IMPLANT
SUT SILK 2 0 (SUTURE)
SUT SILK 2 0 SH CR/8 (SUTURE) IMPLANT
SUT SILK 2-0 18XBRD TIE 12 (SUTURE) IMPLANT
SUT SILK 3 0 (SUTURE)
SUT SILK 3 0 SH CR/8 (SUTURE) ×3 IMPLANT
SUT SILK 3-0 18XBRD TIE 12 (SUTURE) IMPLANT
SUT V-LOC BARB 180 2/0GR6 GS22 (SUTURE) ×9
SUT VIC AB 3-0 SH 18 (SUTURE) IMPLANT
SUT VIC AB 3-0 SH 27 (SUTURE)
SUT VIC AB 3-0 SH 27XBRD (SUTURE) IMPLANT
SUT VICRYL 0 UR6 27IN ABS (SUTURE) ×3 IMPLANT
SUTURE V-LC BRB 180 2/0GR6GS22 (SUTURE) ×6 IMPLANT
SYR 10ML ECCENTRIC (SYRINGE) ×3 IMPLANT
SYR 10ML LL (SYRINGE) ×3 IMPLANT
SYS LAPSCP GELPORT 120MM (MISCELLANEOUS)
SYS WOUND ALEXIS 18CM MED (MISCELLANEOUS) ×3
SYSTEM LAPSCP GELPORT 120MM (MISCELLANEOUS) IMPLANT
SYSTEM WOUND ALEXIS 18CM MED (MISCELLANEOUS) ×2 IMPLANT
TOWEL OR NON WOVEN STRL DISP B (DISPOSABLE) ×3 IMPLANT
TRAY FOLEY MTR SLVR 16FR STAT (SET/KITS/TRAYS/PACK) ×3 IMPLANT
TROCAR ADV FIXATION 5X100MM (TROCAR) ×3 IMPLANT
TUBING CONNECTING 10 (TUBING) ×6 IMPLANT
TUBING INSUFFLATION 10FT LAP (TUBING) ×3 IMPLANT

## 2020-10-24 NOTE — Transfer of Care (Signed)
Immediate Anesthesia Transfer of Care Note  Patient: Clifford [name]  Procedure(s) Performed: XI ROBOTIC LOW ANTERIOR RESECTION , OMENTAL PEDICLE FLAP WITH OMENTOPEXY, PERFUSION ASSESSMENT USING FIREFLY, TAP BLOCK (Abdomen) RIGID PROCTOSCOPY (Rectum)  Patient Location: PACU  Anesthesia Type:General  Level of Consciousness: drowsy  Airway & Oxygen Therapy: Patient Spontanous Breathing and Patient connected to face mask oxygen  Post-op Assessment: Report given to RN and Post -op Vital signs reviewed and stable  Post vital signs: Reviewed and stable  Last Vitals:  Vitals Value Taken Time  BP 142/98 10/24/20 1728  Temp    Pulse 90 10/24/20 1729  Resp 12 10/24/20 1729  SpO2 100 % 10/24/20 1729  Vitals shown include unvalidated device data.  Last Pain:  Vitals:   10/24/20 1212  TempSrc: Oral         Complications: No notable events documented.

## 2020-10-24 NOTE — Anesthesia Preprocedure Evaluation (Addendum)
Anesthesia Evaluation  Patient identified by MRN, date of birth, ID band Patient awake    Reviewed: Allergy & Precautions, NPO status , Patient's Chart, lab work & pertinent test results  Airway Mallampati: II  TM Distance: >3 FB Neck ROM: Full    Dental  (+) Teeth Intact, Dental Advisory Given   Pulmonary neg pulmonary ROS,    breath sounds clear to auscultation       Cardiovascular negative cardio ROS   Rhythm:Regular Rate:Normal     Neuro/Psych negative neurological ROS  negative psych ROS   GI/Hepatic negative GI ROS, Neg liver ROS,   Endo/Other  negative endocrine ROS  Renal/GU      Musculoskeletal   Abdominal Normal abdominal exam  (+)   Peds  Hematology   Anesthesia Other Findings   Reproductive/Obstetrics                            Anesthesia Physical Anesthesia Plan  ASA: 2  Anesthesia Plan: General   Post-op Pain Management:    Induction: Intravenous  PONV Risk Score and Plan: 3 and Ondansetron, Dexamethasone and Midazolam  Airway Management Planned: Oral ETT  Additional Equipment: None  Intra-op Plan:   Post-operative Plan: Extubation in OR  Informed Consent: I have reviewed the patients History and Physical, chart, labs and discussed the procedure including the risks, benefits and alternatives for the proposed anesthesia with the patient or authorized representative who has indicated his/her understanding and acceptance.     Dental advisory given  Plan Discussed with: CRNA  Anesthesia Plan Comments:        Anesthesia Quick Evaluation

## 2020-10-24 NOTE — Interval H&P Note (Signed)
History and Physical Interval Note:  10/24/2020 1:19 PM  Clifford [name]  has presented today for surgery, with the diagnosis of RECTOSIGMOID CANCER.  The various methods of treatment have been discussed with the patient and family. After consideration of risks, benefits and other options for treatment, the patient has consented to  Procedure(s): XI ROBOTIC RESECTION OF RECTOSIGMOID COLON (N/A) RIGID PROCTOSCOPY (N/A) as a surgical intervention.  The patient's history has been reviewed, patient examined, no change in status, stable for surgery.  I have reviewed the patient's chart and labs.  Questions were answered to the patient's satisfaction.    I have re-reviewed the the patient's records, history, medications, and allergies.  I have re-examined the patient.  I again discussed intraoperative plans and goals of post-operative recovery.  The patient agrees to proceed.  Clifford [name]  26-Apr-1975 [id number]  Patient Care Team: Elwyn Reach, MD as PCP - General (Internal Medicine) Michael Boston, MD as Consulting Physician (Colon and Rectal Surgery) Carol Ada, MD as Consulting Physician (Gastroenterology)  There are no problems to display for this patient.   Past Medical History:  Diagnosis Date   Colon cancer ([place])    Pre-diabetes     Past Surgical History:  Procedure Laterality Date   HERNIA REPAIR      Social History   Socioeconomic History   Marital status: Married    Spouse name: Not on file   Number of children: Not on file   Years of education: Not on file   Highest education level: Not on file  Occupational History   Not on file  Tobacco Use   Smoking status: Never   Smokeless tobacco: Never  Vaping Use   Vaping Use: Never used  Substance and Sexual Activity   Alcohol use: Never   Drug use: Never   Sexual activity: Not on file  Other Topics Concern   Not on file  Social History Narrative   Family originally from [place]   Social  Determinants of Health   Financial Resource Strain: Not on file  Food Insecurity: Not on file  Transportation Needs: Not on file  Physical Activity: Not on file  Stress: Not on file  Social Connections: Not on file  Intimate Partner Violence: Not on file    History reviewed. No pertinent family history.  Medications Prior to Admission  Medication Sig Dispense Refill Last Dose   metFORMIN (GLUCOPHAGE) 500 MG tablet Take 500 mg by mouth daily.   10/23/2020    Current Facility-Administered Medications  Medication Dose Route Frequency Provider Last Rate Last Admin   bupivacaine liposome (EXPAREL) 1.3 % injection 266 mg  20 mL Infiltration Once Michael Boston, MD       cefoTEtan (CEFOTAN) 2 g in sodium chloride 0.9 % 100 mL IVPB  2 g Intravenous On Call to OR Michael Boston, MD       feeding supplement (ENSURE PRE-SURGERY) liquid 296 mL  296 mL Oral Once Michael Boston, MD       feeding supplement (ENSURE PRE-SURGERY) liquid 592 mL  592 mL Oral Once Michael Boston, MD       lactated ringers infusion   Intravenous Continuous Myrtie Soman, MD 10 mL/hr at 10/24/20 1300 Continued from Pre-op at 10/24/20 1300     No Known Allergies  BP (!) 160/92   Pulse 98   Temp 98.9 F (37.2 C) (Oral)   Resp 18   SpO2 98%   Labs: No results found for this or any previous visit (  from the past 48 hour(s)).  Imaging / Studies: CT Chest W Contrast  Result Date: 09/25/2020 CLINICAL DATA:  Newly diagnosed colon cancer. EXAM: CT CHEST, ABDOMEN, AND PELVIS WITH CONTRAST TECHNIQUE: Multidetector CT imaging of the chest, abdomen and pelvis was performed following the standard protocol during bolus administration of intravenous contrast. CONTRAST:  46mL OMNIPAQUE IOHEXOL 350 MG/ML SOLN COMPARISON:  None. FINDINGS: CT CHEST FINDINGS Cardiovascular: No significant vascular findings. Normal heart size. No pericardial effusion. Mediastinum/Nodes: No enlarged mediastinal, hilar, or axillary lymph nodes. Thyroid gland,  trachea, and esophagus demonstrate no significant findings. Lungs/Pleura: Lungs are clear. No pleural effusion or pneumothorax. Musculoskeletal: No chest wall mass or suspicious bone lesions identified. CT ABDOMEN PELVIS FINDINGS Hepatobiliary: No focal liver abnormality is seen. No gallstones, gallbladder wall thickening, or biliary dilatation. Pancreas: Unremarkable. No pancreatic ductal dilatation or surrounding inflammatory changes. Spleen: Normal in size without focal abnormality. Adrenals/Urinary Tract: Adrenal glands are unremarkable. Kidneys are normal, without renal calculi, focal lesion, or hydronephrosis. Bladder is unremarkable. Stomach/Bowel: Stomach is within normal limits. Appendix appears normal. No evidence of bowel wall thickening, distention, or inflammatory changes. Vascular/Lymphatic: No definite vascular abnormality is noted. 7 mm lymph node is noted in the right common iliac region best seen on image number 92 of series 2. Reproductive: Prostate is unremarkable. Other: No abdominal wall hernia or abnormality. No abdominopelvic ascites. Musculoskeletal: No acute or significant osseous findings. IMPRESSION: No definite abnormality is noted in the chest. 7 mm right common iliac lymph node is noted concerning for metastatic disease given the history of newly diagnosed colonic malignancy. No other abnormality seen in the abdomen or pelvis. Electronically Signed   By: Marijo Conception M.D.   On: 09/25/2020 20:56   CT Abdomen Pelvis W Contrast  Result Date: 09/25/2020 CLINICAL DATA:  Newly diagnosed colon cancer. EXAM: CT CHEST, ABDOMEN, AND PELVIS WITH CONTRAST TECHNIQUE: Multidetector CT imaging of the chest, abdomen and pelvis was performed following the standard protocol during bolus administration of intravenous contrast. CONTRAST:  80mL OMNIPAQUE IOHEXOL 350 MG/ML SOLN COMPARISON:  None. FINDINGS: CT CHEST FINDINGS Cardiovascular: No significant vascular findings. Normal heart size. No  pericardial effusion. Mediastinum/Nodes: No enlarged mediastinal, hilar, or axillary lymph nodes. Thyroid gland, trachea, and esophagus demonstrate no significant findings. Lungs/Pleura: Lungs are clear. No pleural effusion or pneumothorax. Musculoskeletal: No chest wall mass or suspicious bone lesions identified. CT ABDOMEN PELVIS FINDINGS Hepatobiliary: No focal liver abnormality is seen. No gallstones, gallbladder wall thickening, or biliary dilatation. Pancreas: Unremarkable. No pancreatic ductal dilatation or surrounding inflammatory changes. Spleen: Normal in size without focal abnormality. Adrenals/Urinary Tract: Adrenal glands are unremarkable. Kidneys are normal, without renal calculi, focal lesion, or hydronephrosis. Bladder is unremarkable. Stomach/Bowel: Stomach is within normal limits. Appendix appears normal. No evidence of bowel wall thickening, distention, or inflammatory changes. Vascular/Lymphatic: No definite vascular abnormality is noted. 7 mm lymph node is noted in the right common iliac region best seen on image number 92 of series 2. Reproductive: Prostate is unremarkable. Other: No abdominal wall hernia or abnormality. No abdominopelvic ascites. Musculoskeletal: No acute or significant osseous findings. IMPRESSION: No definite abnormality is noted in the chest. 7 mm right common iliac lymph node is noted concerning for metastatic disease given the history of newly diagnosed colonic malignancy. No other abnormality seen in the abdomen or pelvis. Electronically Signed   By: Marijo Conception M.D.   On: 09/25/2020 20:56     .Adin Hector, M.D., F.A.C.S. Gastrointestinal and Minimally Invasive Surgery [facility] [address] [place],  [address] [phone] Main / Paging  10/24/2020 1:19 PM    Adin Hector

## 2020-10-24 NOTE — H&P (Signed)
10/24/2020    REFERRING PHYSICIAN: Beryle Beams, MD  Patient Care Team: Blima Rich, MD as PCP - General (Internal Medicine) Johney Maine, Adrian Saran, MD as Consulting Provider (Colon and Rectal Surgery) Beryle Beams, MD (Gastroenterology)  PROVIDER: Hollace Kinnier, MD  [facility] MRN: [id number] DOB: 10/13/1975   Subjective   Chief Complaint: New Consultation for sigmoid colon cancer   History of Present Illness: Clifford [name] is a 45 y.o. male who is seen today as an office consultation at the request of Dr. Benson Norway for evaluation of colorectal cancer  Pleasant gentleman. Noticed some rectal bleeding. Concerning. Gastrology consultation made. Sent for colonoscopy. Ulcerating mass found at 17 cm from the anal verge suspected to be in the sigmoid colon. Biopsies concerning for adenocarcinoma. Surgical consultation requested.  Patient comes today by himself. He does not recall any family history of any colorectal problems along any other gastrointestinal issues. He normally moves his bowels about twice a day. He does not smoke. Diagnosed with borderline diabetes with A1c around 6.4. Placed on metformin. No sleep apnea. He had an open left inguinal hernia repair when he was an 46-year-old but no other abdominal surgeries. He is actually quite organized and has his information available on phone and in file, keeping up with his medical health  Medical History: Past Medical History:  Diagnosis Date   Diabetes mellitus without complication (CMS-HCC)   History of cancer   Patient Active Problem List  Diagnosis   Flatulence, eructation and gas pain   Cancer of sigmoid colon (CMS-HCC)   Hematochezia   Past Surgical History:  Procedure Laterality Date   HERNIA REPAIR Left 03/15/1983  left open    Allergies  Allergen Reactions   Ibuprofen Nausea   Nsaids (Non-Steroidal Anti-Inflammatory Drug) Unknown   Current Outpatient Medications on File Prior to Visit   Medication Sig Dispense Refill   metFORMIN (GLUCOPHAGE) 500 MG tablet Take 500 mg by mouth once daily   No current facility-administered medications on file prior to visit.   History reviewed. No pertinent family history.   Social History   Tobacco Use  Smoking Status Never Smoker  Smokeless Tobacco Never Used    Social History   Socioeconomic History   Marital status: Married  Tobacco Use   Smoking status: Never Smoker   Smokeless tobacco: Never Used  Scientific laboratory technician Use: Never used  Substance and Sexual Activity   Alcohol use: Not Currently   Drug use: Not Currently  Social History Narrative  Family originally from [place]   ############################################################  Review of Systems: A complete review of systems (ROS) was obtained from the patient. I have reviewed this information and discussed as appropriate with the patient. See HPI as well for other pertinent ROS.  Constitutional: No fevers, chills, sweats. Weight stable Eyes: No vision changes, No discharge HENT: No sore throats, nasal drainage Lymph: No neck swelling, No bruising easily Pulmonary: No cough, productive sputum CV: No orthopnea, PND Patient walks 60 minutes for about 3 miles without difficulty. No exertional chest/neck/shoulder/arm pain.  GI: No personal nor family history of GI/colon cancer, inflammatory bowel disease, irritable bowel syndrome, allergy such as Celiac Sprue, dietary/dairy problems, colitis, ulcers nor gastritis. No recent sick contacts/gastroenteritis. No travel outside the country. No changes in diet.  Renal: No UTIs, No hematuria Genital: No drainage, bleeding, masses Musculoskeletal: No severe joint pain. Good ROM major joints Skin: No sores or lesions Heme/Lymph: No easy bleeding. No swollen lymph nodes  Objective:  Vitals:  10/14/20 1215  BP: (!) 142/82  Pulse: 98  Temp: 36.3 C (97.3 F)  SpO2: 98%  Weight: 89.7 kg (197 lb 12.8 oz)   Height: 168.9 cm (5' 6.5")   BP (!) 160/92   Pulse 98   Temp 98.9 F (37.2 C) (Oral)   Resp 18   SpO2 98%  10/24/2020    Body mass index is 31.45 kg/m.  PHYSICAL EXAM:  Constitutional: Not cachectic. Hygeine adequate. Vitals signs as above.  Eyes: Pupils reactive, normal extraocular movements. Sclera nonicteric Neuro: CN II-XII intact. No major focal sensory defects. No major motor deficits. Lymph: No head/neck/groin lymphadenopathy Psych: No severe agitation. No severe anxiety. Judgment & insight Adequate, Oriented x4, HENT: Normocephalic, Mucus membranes moist. No thrush.  Neck: Supple, No tracheal deviation. No obvious thyromegaly Chest: No pain to chest wall compression. Good respiratory excursion. No audible wheezing CV: Pulses intact. Regular rhythm. No major extremity edema  Abdomen: Obese Hernia: Not present. Diastasis recti: Mild supraumbilical midline. Soft. Nondistended. Nontender. No hepatomegaly. No splenomegaly  Gen: Inguinal hernia: Not present. Left groin incision consistent with prior open hernia repair. No recurrence. Inguinal lymph nodes: without lymphadenopathy.   Rectal: Perianal skin clear with no fissure fistula or abscess. Normal sphincter tone. No masses palpated to 8 cm by digital rectal examination. Prostate smooth and not enlarged.  Ext: No obvious deformity or contracture. Edema: Not present. No cyanosis Skin: No major subcutaneous nodules. Warm and dry Musculoskeletal: Severe joint rigidity not present. No obvious clubbing. No digital petechiae.   Labs, Imaging and Diagnostic Testing:  Located in [place]' section of Epic EMR chart  PRIOR NOTES   Not applicable  SURGERY NOTES:  Not applicable  PATHOLOGY:  Located in [place]' section of Epic EMR chart  Assessment and Plan:  DIAGNOSES:  Diagnoses and all orders for this visit:  Cancer of sigmoid colon (CMS-HCC)  Hematochezia  Diabetes mellitus type 2,  noninsulin dependent (CMS-HCC)  BMI 31.0-31.9,adult  GERD without esophagitis  Other orders - polyethylene glycol (MIRALAX) powder; Take 233.75 g by mouth once for 1 dose Take according to your procedure prep instructions. - bisacodyL (DULCOLAX) 5 mg EC tablet; Take 4 tablets (20 mg total) by mouth once daily as needed for Constipation for up to 1 dose - metroNIDAZOLE (FLAGYL) 500 MG tablet; Take 2 tablets (1,000 mg total) by mouth 3 (three) times daily for 3 doses SEE colon prep instructions: Take 2 tablets at 2pm, 3pm, and 10pm the day prior to your colon operation. - neomycin 500 mg tablet; Take 2 tablets (1,000 mg total) by mouth 3 (three) times daily for 3 doses SEE colon prep instructions: Take 2 tablets at 2pm, 3pm, and 10pm the day prior to your colon operation.    ASSESSMENT/PLAN  Pleasant gentleman with persistent hematochezia was found to have bulky mass 17 cm from the anal verge. Looking at the CAT scan this looks like he has a distal sigmoid mass/thickening that correlates with Dr. Benson Norway suspicion that this is in the distal sigmoid.Marland Kitchen Biopsy shows obvious adenocarcinoma. Suspicious lymph node I think is in his rectosigmoid mesentery & not the right iliac.    I think standard of care is segmental colonic resection. Reasonable robotic approach. I think this is proximal enough that it does not require neoadjuvant chemoradiation therapy and I would do surgery first.   The anatomy & physiology of the digestive tract was discussed. The pathophysiology of the colon was discussed. Natural history risks without surgery was  discussed. I feel the risks of no intervention will lead to serious problems that outweigh the operative risks; therefore, I recommended a partial colectomy to remove the pathology. Minimally invasive (Robotic/Laparoscopic) & open techniques were discussed.   Risks such as bleeding, infection, abscess, leak, reoperation, injury to other organs, need for repair of tissues  / organs, possible ostomy, hernia, heart attack, stroke, death, and other risks were discussed. I noted a good likelihood this will help address the problem. Goals of post-operative recovery were discussed as well. Need for adequate nutrition, daily bowel regimen and healthy physical activity, to optimize recovery was noted as well. We will work to minimize complications. Educational materials were available as well. Questions were answered. The patient expresses understanding & wishes to proceed with surgery.   FOLLOWUP: Return for Plan to schedule surgery. See instructions.  I had direct face-to-face contact with the patient for a total of 40 minutes and greater than 50% of that time was spent providing counseling and/or coordination of care for the patient regarding the above.  ########################################################  Adin Hector, MD, FACS, MASCRS Esophageal, Gastrointestinal & Colorectal Surgery Robotic and Minimally Invasive Surgery  [facility], [place]  [place]. [address], [place], [place] [address] [phone] Fax [phone] Main       Adin Hector, MD, FACS, MASCRS Esophageal, Gastrointestinal & Colorectal Surgery Robotic and Minimally Invasive Surgery  [facility], [place]  [place]. [address] [place], [place] [address] [phone] Fax [phone] Main  CONTACT INFORMATION:  Weekday (9AM-5PM): Call [facility] main office at [phone]  Weeknight (5PM-9AM) or Weekend/Holiday: Check [email or web address] (password " [email or web address]") for General Surgery [facility] coverage  (Please, do not use SecureChat as it is not reliable communication to operating surgeons for immediate patient care)

## 2020-10-24 NOTE — Discharge Instructions (Signed)
SURGERY: POST OP INSTRUCTIONS (Surgery for small bowel obstruction, colon resection, etc)   ######################################################################  EAT Gradually transition to a high fiber diet with a fiber supplement over the next few days after discharge  WALK Walk an hour a day.  Control your pain to do that.    CONTROL PAIN Control pain so that you can walk, sleep, tolerate sneezing/coughing, go up/down stairs.  HAVE A BOWEL MOVEMENT DAILY Keep your bowels regular to avoid problems.  OK to try a laxative to override constipation.  OK to use an antidairrheal to slow down diarrhea.  Call if not better after 2 tries  CALL IF YOU HAVE PROBLEMS/CONCERNS Call if you are still struggling despite following these instructions. Call if you have concerns not answered by these instructions  ######################################################################   DIET Follow a light diet the first few days at home.  Start with a bland diet such as soups, liquids, starchy foods, low fat foods, etc.  If you feel full, bloated, or constipated, stay on a ful liquid or pureed/blenderized diet for a few days until you feel better and no longer constipated. Be sure to drink plenty of fluids every day to avoid getting dehydrated (feeling dizzy, not urinating, etc.). Gradually add a fiber supplement to your diet over the next week.  Gradually get back to a regular solid diet.  Avoid fast food or heavy meals the first week as you are more likely to get nauseated. It is expected for your digestive tract to need a few months to get back to normal.  It is common for your bowel movements and stools to be irregular.  You will have occasional bloating and cramping that should eventually fade away.  Until you are eating solid food normally, off all pain medications, and back to regular activities; your bowels will not be normal. Focus on eating a low-fat, high fiber diet the rest of your life  (See Getting to [place], below).  CARE of your INCISION or WOUND It is good for closed incision and even open wounds to be washed every day.  Shower every day.  Short baths are fine.  Wash the incisions and wounds clean with soap & water.     If you have a closed incision(s), wash the incision with soap & water every day.  You may leave closed incisions open to air if it is dry.   You may cover the incision with clean gauze & replace it after your daily shower for comfort.  It is good for closed incisions and even open wounds to be washed every day.  Shower every day.  Short baths are fine.  Wash the incisions and wounds clean with soap & water.    You may leave closed incisions open to air if it is dry.   You may cover the incision with clean gauze & replace it after your daily shower for comfort.  TEGADERM:  You have clear gauze band-aid dressings over your closed incision(s).  Remove the dressings 3 days after surgery. (Monday 10/27/2020)   If you have an open wound with a wound vac, see wound vac care instructions.     ACTIVITIES as tolerated Start light daily activities --- self-care, walking, climbing stairs-- beginning the day after surgery.  Gradually increase activities as tolerated.  Control your pain to be active.  Stop when you are tired.  Ideally, walk several times a day, eventually an hour a day.   Most people are back to most day-to-day  activities in a few weeks.  It takes 4-8 weeks to get back to unrestricted, intense activity. If you can walk 30 minutes without difficulty, it is safe to try more intense activity such as jogging, treadmill, bicycling, low-impact aerobics, swimming, etc. Save the most intensive and strenuous activity for last (Usually 4-8 weeks after surgery) such as sit-ups, heavy lifting, contact sports, etc.  Refrain from any intense heavy lifting or straining until you are off narcotics for pain control.  You will have off days, but things should  improve week-by-week. DO NOT PUSH THROUGH PAIN.  Let pain be your guide: If it hurts to do something, don't do it.  Pain is your body warning you to avoid that activity for another week until the pain goes down. You may drive when you are no longer taking narcotic prescription pain medication, you can comfortably wear a seatbelt, and you can safely make sudden turns/stops to protect yourself without hesitating due to pain. You may have sexual intercourse when it is comfortable. If it hurts to do something, stop.  MEDICATIONS Take your usually prescribed home medications unless otherwise directed.   Blood thinners:  Usually you can restart any strong blood thinners after the second postoperative day.  It is OK to take aspirin right away.     If you are on strong blood thinners (warfarin/Coumadin, Plavix, Xerelto, Eliquis, Pradaxa, etc), discuss with your surgeon, medicine PCP, and/or cardiologist for instructions on when to restart the blood thinner & if blood monitoring is needed (PT/INR blood check, etc).     PAIN CONTROL Pain after surgery or related to activity is often due to strain/injury to muscle, tendon, nerves and/or incisions.  This pain is usually short-term and will improve in a few months.  To help speed the process of healing and to get back to regular activity more quickly, DO THE FOLLOWING THINGS TOGETHER: Increase activity gradually.  DO NOT PUSH THROUGH PAIN Use Ice and/or Heat Try Gentle Massage and/or Stretching Take over the counter pain medication Take Narcotic prescription pain medication for more severe pain  Good pain control = faster recovery.  It is better to take more medicine to be more active than to stay in bed all day to avoid medications.  Increase activity gradually Avoid heavy lifting at first, then increase to lifting as tolerated over the next 6 weeks. Do not "push through" the pain.  Listen to your body and avoid positions and maneuvers than reproduce the  pain.  Wait a few days before trying something more intense Walking an hour a day is encouraged to help your body recover faster and more safely.  Start slowly and stop when getting sore.  If you can walk 30 minutes without stopping or pain, you can try more intense activity (running, jogging, aerobics, cycling, swimming, treadmill, sex, sports, weightlifting, etc.) Remember: If it hurts to do it, then don't do it! Use Ice and/or Heat You will have swelling and bruising around the incisions.  This will take several weeks to resolve. Ice packs or heating pads (6-8 times a day, 30-60 minutes at a time) will help sooth soreness & bruising. Some people prefer to use ice alone, heat alone, or alternate between ice & heat.  Experiment and see what works best for you.  Consider trying ice for the first few days to help decrease swelling and bruising; then, switch to heat to help relax sore spots and speed recovery. Shower every day.  Short baths are fine.  It  feels good!  Keep the incisions and wounds clean with soap & water.   Try Gentle Massage and/or Stretching Massage at the area of pain many times a day Stop if you feel pain - do not overdo it Take over the counter pain medication This helps the muscle and nerve tissues become less irritable and calm down faster Choose ONE of the following over-the-counter anti-inflammatory medications: Acetaminophen 500mg  tabs (Tylenol) 1-2 pills with every meal and just before bedtime (avoid if you have liver problems or if you have acetaminophen in you narcotic prescription) Naproxen 220mg  tabs (ex. Aleve, Naprosyn) 1-2 pills twice a day (avoid if you have kidney, stomach, IBD, or bleeding problems) Ibuprofen 200mg  tabs (ex. Advil, Motrin) 3-4 pills with every meal and just before bedtime (avoid if you have kidney, stomach, IBD, or bleeding problems) Take with food/snack several times a day as directed for at least 2 weeks to help keep pain / soreness down & more  manageable. Take Narcotic prescription pain medication for more severe pain A prescription for strong pain control is often given to you upon discharge (for example: oxycodone/Percocet, hydrocodone/Norco/Vicodin, or tramadol/Ultram) Take your pain medication as prescribed. Be mindful that most narcotic prescriptions contain Tylenol (acetaminophen) as well - avoid taking too much Tylenol. If you are having problems/concerns with the prescription medicine (does not control pain, nausea, vomiting, rash, itching, etc.), please call us [phone] to see if we need to switch you to a different pain medicine that will work better for you and/or control your side effects better. If you need a refill on your pain medication, you must call the office before 4 pm and on weekdays only.  By federal law, prescriptions for narcotics cannot be called into a pharmacy.  They must be filled out on paper & picked up from our office by the patient or authorized caretaker.  Prescriptions cannot be filled after 4 pm nor on weekends.    WHEN TO CALL us [phone] Severe uncontrolled or worsening pain  Fever over 101 F (38.5 C) Concerns with the incision: Worsening pain, redness, rash/hives, swelling, bleeding, or drainage Reactions / problems with new medications (itching, rash, hives, nausea, etc.) Nausea and/or vomiting Difficulty urinating Difficulty breathing Worsening fatigue, dizziness, lightheadedness, blurred vision Other concerns If you are not getting better after two weeks or are noticing you are getting worse, contact our office [phone] for further advice.  We may need to adjust your medications, re-evaluate you in the office, send you to the emergency room, or see what other things we can do to help. The clinic staff is available to answer your questions during regular business hours (8:30am-5pm).  Please don't hesitate to call and ask to speak to one of our nurses for clinical concerns.    A  surgeon from [facility] is always on call at the hospitals 24 hours/day If you have a medical emergency, go to the nearest emergency room or call 911.  FOLLOW UP in our office One the day of your discharge from the hospital (or the next business weekday), please call [facility] to set up or confirm an appointment to see your surgeon in the office for a follow-up appointment.  Usually it is 2-3 weeks after your surgery.   If you have skin staples at your incision(s), let the office know so we can set up a time in the office for the nurse to remove them (usually around 10 days after surgery). Make sure that  you call for appointments the day of discharge (or the next business weekday) from the hospital to ensure a convenient appointment time. IF YOU HAVE DISABILITY OR FAMILY LEAVE FORMS, BRING THEM TO THE OFFICE FOR PROCESSING.  DO NOT GIVE THEM TO YOUR DOCTOR.  [facility] [address], [place], [place], [place]  [address] ? [phone] - Main [phone] - [place],  [phone] - Fax [email or web address]  GETTING TO GOOD BOWEL HEALTH. It is expected for your digestive tract to need a few months to get back to normal.  It is common for your bowel movements and stools to be irregular.  You will have occasional bloating and cramping that should eventually fade away.  Until you are eating solid food normally, off all pain medications, and back to regular activities; your bowels will not be normal.   Avoiding constipation The goal: ONE SOFT BOWEL MOVEMENT A DAY!    Drink plenty of fluids.  Choose water first. TAKE A FIBER SUPPLEMENT EVERY DAY THE REST OF YOUR LIFE During your first week back home, gradually add back a fiber supplement every day Experiment which form you can tolerate.   There are many forms such as powders, tablets, wafers, gummies, etc Psyllium bran (Metamucil), methylcellulose (Citrucel), Miralax or Glycolax,  Benefiber, Flax Seed.  Adjust the dose week-by-week (1/2 dose/day to 6 doses a day) until you are moving your bowels 1-2 times a day.  Cut back the dose or try a different fiber product if it is giving you problems such as diarrhea or bloating. Sometimes a laxative is needed to help jump-start bowels if constipated until the fiber supplement can help regulate your bowels.  If you are tolerating eating & you are farting, it is okay to try a gentle laxative such as double dose MiraLax, prune juice, or Milk of Magnesia.  Avoid using laxatives too often. Stool softeners can sometimes help counteract the constipating effects of narcotic pain medicines.  It can also cause diarrhea, so avoid using for too long. If you are still constipated despite taking fiber daily, eating solids, and a few doses of laxatives, call our office. Controlling diarrhea Try drinking liquids and eating bland foods for a few days to avoid stressing your intestines further. Avoid dairy products (especially milk & ice cream) for a short time.  The intestines often can lose the ability to digest lactose when stressed. Avoid foods that cause gassiness or bloating.  Typical foods include beans and other legumes, cabbage, broccoli, and dairy foods.  Avoid greasy, spicy, fast foods.  Every person has some sensitivity to other foods, so listen to your body and avoid those foods that trigger problems for you. Probiotics (such as active yogurt, Align, etc) may help repopulate the intestines and colon with normal bacteria and calm down a sensitive digestive tract Adding a fiber supplement gradually can help thicken stools by absorbing excess fluid and retrain the intestines to act more normally.  Slowly increase the dose over a few weeks.  Too much fiber too soon can backfire and cause cramping & bloating. It is okay to try and slow down diarrhea with a few doses of antidiarrheal medicines.   Bismuth subsalicylate (ex. Kayopectate, Pepto Bismol)  for a few doses can help control diarrhea.  Avoid if pregnant.   Loperamide (Imodium) can slow down diarrhea.  Start with one tablet (2mg ) first.  Avoid if you are having fevers or severe pain.  ILEOSTOMY PATIENTS WILL HAVE CHRONIC DIARRHEA since  their colon is not in use.    Drink plenty of liquids.  You will need to drink even more glasses of water/liquid a day to avoid getting dehydrated. Record output from your ileostomy.  Expect to empty the bag every 3-4 hours at first.  Most people with a permanent ileostomy empty their bag 4-6 times at the least.   Use antidiarrheal medicine (especially Imodium) several times a day to avoid getting dehydrated.  Start with a dose at bedtime & breakfast.  Adjust up or down as needed.  Increase antidiarrheal medications as directed to avoid emptying the bag more than 8 times a day (every 3 hours). Work with your wound ostomy nurse to learn care for your ostomy.  See ostomy care instructions. TROUBLESHOOTING IRREGULAR BOWELS 1) Start with a soft & bland diet. No spicy, greasy, or fried foods.  2) Avoid gluten/wheat or dairy products from diet to see if symptoms improve. 3) Miralax 17gm or flax seed mixed in [place]. water or juice-daily. May use 2-4 times a day as needed. 4) Gas-X, Phazyme, etc. as needed for gas & bloating.  5) Prilosec (omeprazole) over-the-counter as needed 6)  Consider probiotics (Align, Activa, etc) to help calm the bowels down  Call your doctor if you are getting worse or not getting better.  Sometimes further testing (cultures, endoscopy, X-ray studies, CT scans, bloodwork, etc.) may be needed to help diagnose and treat the cause of the diarrhea. [facility], [place], [place], [place], [place]  [address] [phone] - Main.    [phone]  - Toll Free.   [phone] - Fax [email or web address]

## 2020-10-24 NOTE — Anesthesia Procedure Notes (Signed)
Procedure Name: Intubation Date/Time: 10/24/2020 2:19 PM Performed by: Milford Cage, CRNA Pre-anesthesia Checklist: Patient identified, Emergency Drugs available, Suction available and Patient being monitored Patient Re-evaluated:Patient Re-evaluated prior to induction Oxygen Delivery Method: Circle system utilized Preoxygenation: Pre-oxygenation with 100% oxygen Induction Type: IV induction Ventilation: Mask ventilation without difficulty Laryngoscope Size: Miller and 2 Grade View: Grade I Tube type: Oral Tube size: 7.5 mm Number of attempts: 1 Airway Equipment and Method: Stylet Placement Confirmation: ETT inserted through vocal cords under direct vision, positive ETCO2 and breath sounds checked- equal and bilateral Secured at: 22 cm Tube secured with: Tape Dental Injury: Teeth and Oropharynx as per pre-operative assessment

## 2020-10-24 NOTE — Op Note (Signed)
10/24/2020  5:22 PM  PATIENT:  Clifford [name]  45 y.o. male  Patient Care Team: Elwyn Reach, MD as PCP - General (Internal Medicine) Michael Boston, MD as Consulting Physician (Colon and Rectal Surgery) Carol Ada, MD as Consulting Physician (Gastroenterology)  PRE-OPERATIVE DIAGNOSIS:  RECTOSIGMOID CANCER  POST-OPERATIVE DIAGNOSIS:  RECTOSIGMOID CANCER  PROCEDURE:   -XI ROBOTIC LOW ANTERIOR RECTOSIGMOID RESECTION -OMENTAL PEDICLE FLAP WITH OMENTOPEXY -EXCISION OF PARAAORTIC LYMPH NODE -INTRAOPERATIVE ASSESSMENT OF TISSUE VASCULAR PERFUSION USING ICG (indocyanine green) IMMUNOFLUORESCENCE -TRANSVERSUS ABDOMINIS PLANE (TAP) BLOCK - BILATERAL -RIGID PROCTOSCOPY  SURGEON:  Adin Hector, MD  ASSISTANT:  Nadeen Landau, MD  Leighton Ruff, MD  An experienced assistant was required given the standard of surgical care given the complexity of the case.  This assistant was needed for exposure, dissection, suction, tissue approximation, retraction, perception, etc.  ANESTHESIA:     General  Regional TRANSVERSUS ABDOMINIS PLANE (TAP) nerve block for perioperative & postoperative pain control provided with liposomal bupivacaine (Experel) mixed with 0.25% bupivacaine as a Bilateral TAP block x 2mL each side at the level of the transverse abdominis & preperitoneal spaces along the flank at the anterior axillary line, from subcostal ridge to iliac crest under laparoscopic guidance   Local field block at port sites & extraction wound  EBL:  Total I/O In: 100 [IV Piggyback:100] Out: 250 [Urine:150; Blood:100]  Delay start of Pharmacological VTE agent (>24hrs) due to surgical blood loss or risk of bleeding:  no  DRAINS: No  SPECIMEN:   RECTOSIGMOID COLON (open end proximal) with omentum DISTAL ANASTOMOTIC RING (final distal margin) PARA-AORTIC LYMPH NODE  DISPOSITION OF SPECIMEN:  PATHOLOGY  COUNTS:  YES  PLAN OF CARE: Admit to inpatient   PATIENT DISPOSITION:   PACU - hemodynamically stable.  INDICATION:    Pleasant patient with hematochezia.  Underwent anoscopy and found to have friable mass at 17 cm from the anal verge.  Biopsy consistent with adenocarcinoma.  Felt to be at the rectosigmoid region with no benefit to neoadjuvant chemoradiation therapy.  Therefore I recommended starting with segmental resection:  The anatomy & physiology of the digestive tract was discussed.  The pathophysiology was discussed.  Natural history risks without surgery was discussed.   I worked to give an overview of the disease and the frequent need to have multispecialty involvement.  I feel the risks of no intervention will lead to serious problems that outweigh the operative risks; therefore, I recommended a partial colectomy to remove the pathology.  Laparoscopic & open techniques were discussed.   Risks such as bleeding, infection, abscess, leak, reoperation, possible ostomy, hernia, heart attack, death, and other risks were discussed.  I noted a good likelihood this will help address the problem.   Goals of post-operative recovery were discussed as well.  We will work to minimize complications.  Educational materials on the pathology had been given in the office.  Questions were answered.    The patient expressed understanding & wished to proceed with surgery.  OR FINDINGS:   Patient had mass at rectosigmoid junction between tattooing.  No obvious metastatic disease on visceral parietal peritoneum or liver.  The anastomosis rests 12 cm from the anal verge by rigid proctoscopy.  Is a 84 EEA stapled anastomosis.  Initially small anterior midline leak at stapled epiploic appendage.  Oversewn with omental pedicle flap and omentopexy.  Repeat air leak test negative.  CASE DATA:  Type of patient?: Elective [facility] Private Case  Status of Case? Elective Scheduled  Infection Present At Time Of Surgery (PATOS)?  NO  DESCRIPTION:   Informed consent was confirmed.  The  patient underwent general anaesthesia without difficulty.  The patient was positioned appropriately.  VTE prevention in place.  The patient was clipped, prepped, & draped in a sterile fashion.  Surgical timeout confirmed our plan.  The patient was positioned in reverse Trendelenburg.  Abdominal entry was gained using Varess technique at the left subcostal ridge on the anterior abdominal wall.  No elevated EtCO2 noted.  Port placed.  Camera inspection revealed no injury.  Extra ports were carefully placed under direct laparoscopic visualization.  I reflected the greater omentum and the upper abdomen the small bowel in the upper abdomen.  The patient was carefully positioned.  The [employer] daVinci robot was docked with camera & instruments carefully placed.  The patient had a mobile sigmoid and rectosigmoid colon.  Tattooing in the distal sigmoid and proximal rectum.  Firm mass felt at the rectosigmoid region.  It was mobile without any attachments.  I mobilized the rectosigmoid colon & elevated it to put the main pedicle on tension.  I scored the base of peritoneum of the medial side of the mesentery of the elevated left colon from the ligament of Treitz to the mid rectum.   I elevated the sigmoid mesentery and entered into the retro-mesenteric plane. We were able to identify the left ureter and gonadal vessels. We kept those posterior within the retroperitoneum and elevated the left colon mesentery off that. I did isolate the inferior mesenteric artery (IMA) pedicle but did not ligate it yet.  I continued distally and got into the avascular plane posterior to the mesorectum, sparing the nervi ergentes.. This allowed me to help mobilize the rectum as well by freeing the mesorectum off the sacrum.  I stayed away from the right and left ureters.  I kept the lateral vascular pedicles to the rectum intact.  I skeletonized the lymph nodes off the inferior mesenteric artery pedicle.  I went down to its takeoff from  the aorta.  I isolated the inferior mesenteric vein off of the ligament of Treitz just cephalad to that as well.  After confirming the left ureter was out of the way, I went ahead and ligated the inferior mesenteric artery pedicle just near its takeoff from the aorta.  I did ligate the inferior mesenteric vein proximally to the ligament of Treitz in a similar fashion.  There was a persistent ellipsoid tattooed mass consistent with a probable periaortic sentinel like lymph node from the prior tattooing.  I enucleated it off the aorta, taking care to preserve vessels nerves and lymphatics.  Sent that separately.  We ensured hemostasis.  I continued medial to lateral dissection to free the left colon mesentery off the retroperitoneum going up towards the splenic flexure to allow good mobility and protect the colon mesentery.  I mobilized the left colon in a lateral to medial fashion off the retroperitoneum and sidewall attachments along the line of Toldt up towards the splenic flexure to ensure good mobilization of the remaining left colon to reach into the pelvis.   We then focused on mesorectal dissection.  Freed the mesorectum off the presacral plane until I was distal to the concerning region.  Freed off peritoneum on the lateral sidewalls as well and transected the mesentery of the lateral pedicles to get distal to the area of concern.  Came around anteriorly such that I had good circumferential mesorectal excision and a good margin  distal to the area of concern.  I chose a region at the descending/sigmoid junction that was soft and easily reached down to the rectal stump.  I transected the mesentery of the colon radially to preserve remaining colon blood supply.  I skeletonized the mesorectum and transected at the proximal rectum using a robotic stapler.  We then chose a region for the proximal margin that would reach well for our planned anastomosis.  Transected the colon mesentery radially to preserve good  collateral and marginal artery blood supply.  We created an extraction incision through a small Pfannenstiel incision in the suprapubic region.  Placed a wound protector.  I was able to eviscerate the rectosigmoid and descending colon out the wound.   I clamped the colon proximal to this area using a reusable pursestringer device.  Passed a 2-0 Keith needle. I transected at the descending/sigmoid junction with a scalpel. I got healthy bleeding mucosa.  We sent the rectosigmoid colon specimen off to go to pathology.  We sized the colon orifice.  I chose a 52mm EEA anvil stapler system.  I reinforced the prolene pursestring with interrupted silk suture.  I placed the anvil to the open end of the proximal remaining colon and closed around it using the pursestring.    We did copious irrigation with crystalloid solution.  Hemostasis was good.  The distal end of the remaining colon easily reached down to the rectal stump, therefore, splenic flexure mobilization was not needed.      At this point, Dr. Dema Severin had to leave and Dr. Marcello Moores came in to relieve him to continue assisting.  Dr Marcello Moores scrubbed down and did gentle anal dilation and advanced the EEA stapler up the rectal stump. The spike was brought out at the provimal end of the rectal stump under direct visualization.  I  attached the anvil of the proximal colon the spike of the stapler. Anvil was tightened down and held clamped for 60 seconds. The EEA stapler was fired and held clamped for 30 seconds. The stapler was released & removed. We noted 2 excellent anastomotic rings. Blue stitch is in the proximal ring.  Dr Marcello Moores  did rigid proctoscopy noted the anastomosis was at 12 cm from the anal verge consistent with the proximal rectum.  We irrigation of isotonic solution & held that for the pelvic air leak test .  The rectum was insufflated the rectum while clamping the colon proximal to that anastomosis.  There was small bubbling anteriorly.  We did careful  investigation and noted at the anterior midline near an epiploic pad and should there was a small area of bubbling.  There is no obvious ischemia nor tension at the anastomosis.  We decided to primarily repair this and he could be seen.  The patient was repositioned and the robot carefully docked.  The omentum could not reach down into the pelvis, so I decided to create an omental pedicle flap for omentopexy to protect anastomosis.  There seemed to be more mobility on the left side.  I transected the greater omentum off the mid greater curvature of the stomach towards the spleen.  I took the gastroepiploic with that.  Then freed the distal half of the omentum off of the transverse colon and splenic flexure and descending colon.  That created better mobility for an omental pedicle flap that appeared viable and could reach down into the pelvis.  To access vascular perfusion of tissues, we asked anesthesia use intravenous  indocyanine green (ICG)  with IV flush.  I switched to the NIR fluorescence (Firefly mode) imaging window on the daVinci robot platform.  We were able to see good light green visualization of blood vessels with good vascular perfusion of tissues, confirming good tissue perfusion of most of the omental pedicle flap.  There was a distal segment that was nonviable and this was transected off and removed.  I could see good perfusion of the colon and the rectum at the anastomosis arguing against any ischemia.    I then oversewed the anterior 180 degree circumference of the EEA colorectal anastomosis using a 2 OV lock starting left lateral ongoing right lateral.  I then laid the greater omentum over this repair and came through in and out the omentum forward omentopexy in the region as well.  Robot was undocked.  Patient repositioned.  This time I did rigid proctoscopy while Dr. Marcello Moores clamped the colon more proximally.  I did rigid proctoscopy confirmed the anastomosis was at 12 cm.  Got good into  flexion across the anastomosis under water with no leaking or bubbles.  Good airtight seal.  Good vascularity.  There was a negative air leak test. There was no tension of mesentery or bowel at the anastomosis.   Tissues looked viable.  Ureters & bowel uninjured.  The anastomosis looked healthy.   Endoluminal gas was evacuated.  Ports & wound protector removed.  We changed gloves & redraped the patient per colon SSI prevention protocol.  We aspirated the antibiotic irrigation.  Hemostasis was good.  Sterile unused instruments were used from this point.  I closed the skin at the port sites using Monocryl stitch and sterile dressing.  I closed the extraction wound using a 0 Vicryl vertical peritoneal closure and a #1 PDS transverse anterior rectal fascial closure like a small Pfannenstiel closure. I closed the skin with some interrupted Monocryl stitches. I placed sterile dressings.     Patient is being extubated go to recovery room. I had discussed postop care with the patient in detail the office & in the holding area. Instructions are written. I discussed operative findings, updated the patient's status, discussed probable steps to recovery, and gave postoperative recommendations to the patient's spouse, Bunmi.  Recommendations were made.  Questions were answered.  She expressed understanding & appreciation.  Adin Hector, M.D., F.A.C.S. Gastrointestinal and Minimally Invasive Surgery [facility] [address], [place], [place] [address] [phone] Main / Paging

## 2020-10-25 LAB — CBC
HCT: 39.6 % (ref 39.0–52.0)
Hemoglobin: 12.4 g/dL — ABNORMAL LOW (ref 13.0–17.0)
MCH: 20.5 pg — ABNORMAL LOW (ref 26.0–34.0)
MCHC: 31.3 g/dL (ref 30.0–36.0)
MCV: 65.5 fL — ABNORMAL LOW (ref 80.0–100.0)
Platelets: 225 10*3/uL (ref 150–400)
RBC: 6.05 MIL/uL — ABNORMAL HIGH (ref 4.22–5.81)
RDW: 15.2 % (ref 11.5–15.5)
WBC: 9.7 10*3/uL (ref 4.0–10.5)
nRBC: 0 % (ref 0.0–0.2)

## 2020-10-25 LAB — BASIC METABOLIC PANEL
Anion gap: 9 (ref 5–15)
BUN: 7 mg/dL (ref 6–20)
CO2: 26 mmol/L (ref 22–32)
Calcium: 9.4 mg/dL (ref 8.9–10.3)
Chloride: 108 mmol/L (ref 98–111)
Creatinine, Ser: 0.97 mg/dL (ref 0.61–1.24)
GFR, Estimated: >60 mL/min (ref >60)
Glucose, Bld: 137 mg/dL — ABNORMAL HIGH (ref 70–99)
Potassium: 4.1 mmol/L (ref 3.5–5.1)
Sodium: 143 mmol/L (ref 135–145)

## 2020-10-25 LAB — GLUCOSE, CAPILLARY
Glucose-Capillary: 116 mg/dL — ABNORMAL HIGH (ref 70–99)
Glucose-Capillary: 139 mg/dL — ABNORMAL HIGH (ref 70–99)
Glucose-Capillary: 142 mg/dL — ABNORMAL HIGH (ref 70–99)
Glucose-Capillary: 175 mg/dL — ABNORMAL HIGH (ref 70–99)

## 2020-10-25 LAB — MAGNESIUM: Magnesium: 1.8 mg/dL (ref 1.7–2.4)

## 2020-10-25 MED ORDER — DEXAMETHASONE SODIUM PHOSPHATE 10 MG/ML IJ SOLN
10.0000 mg | Freq: Once | INTRAMUSCULAR | Status: AC
  Administered 2020-10-25: 10 mg via INTRAVENOUS
  Filled 2020-10-25: qty 1

## 2020-10-25 MED ORDER — MAGNESIUM SULFATE 2 GM/50ML IV SOLN
2.0000 g | Freq: Once | INTRAVENOUS | Status: AC
  Administered 2020-10-25: 2 g via INTRAVENOUS
  Filled 2020-10-25: qty 50

## 2020-10-25 MED ORDER — MAGIC MOUTHWASH W/LIDOCAINE
5.0000 mL | Freq: Four times a day (QID) | ORAL | Status: DC
  Administered 2020-10-25 (×3): 5 mL via ORAL
  Filled 2020-10-25 (×5): qty 5

## 2020-10-25 NOTE — Progress Notes (Signed)
1 Day Post-Op   Subjective/Chief Complaint: Slept well, reports pain is well controlled.  No nausea, some bloating, endorses flatus.   Objective: Vital signs in last 24 hours: Temp:  [97.8 F (36.6 C)-98.9 F (37.2 C)] 98.5 F (36.9 C) (10/08 0541) Pulse Rate:  [83-98] 93 (10/08 0541) Resp:  [12-18] 18 (10/08 0541) BP: (127-160)/(81-98) 137/81 (10/08 0541) SpO2:  [95 %-100 %] 98 % (10/08 0541) Weight:  [89 kg] 89 kg (10/07 1800) Last BM Date: 10/23/20  Intake/Output from previous day: 10/07 0701 - 10/08 0700 In: 2268.3 [P.O.:400; I.V.:1668.3; IV Piggyback:200] Out: 3225 [Urine:3125; Blood:100] Intake/Output this shift: No intake/output data recorded.  Alert, well-appearing Unlabored respirations Abdomen is soft, nontender, nondistended.  Incisions with OR dressings that are clean and dry. No lower extremity edema  Lab Results:  Recent Labs    10/25/20 0510  WBC 9.7  HGB 12.4*  HCT 39.6  PLT 225   BMET Recent Labs    10/25/20 0510  NA 143  K 4.1  CL 108  CO2 26  GLUCOSE 137*  BUN 7  CREATININE 0.97  CALCIUM 9.4   PT/INR No results for input(s): LABPROT, INR in the last 72 hours. ABG No results for input(s): PHART, HCO3 in the last 72 hours.  Invalid input(s): PCO2, PO2  Studies/Results: No results found.  Anti-infectives: Anti-infectives (From admission, onward)    Start     Dose/Rate Route Frequency Ordered Stop   10/25/20 0600  cefoTEtan (CEFOTAN) 2 g in sodium chloride 0.9 % 100 mL IVPB  Status:  Discontinued        2 g 200 mL/hr over 30 Minutes Intravenous On call to O.R. 10/24/20 1214 10/24/20 1222   10/25/20 0200  cefoTEtan (CEFOTAN) 2 g in sodium chloride 0.9 % 100 mL IVPB        2 g 200 mL/hr over 30 Minutes Intravenous Every 12 hours 10/24/20 1704 10/25/20 0147   10/24/20 1400  neomycin (MYCIFRADIN) tablet 1,000 mg  Status:  Discontinued       See Hyperspace for full Linked Orders Report.   1,000 mg Oral 3 times per day 10/24/20 1214  10/24/20 1220   10/24/20 1400  metroNIDAZOLE (FLAGYL) tablet 1,000 mg  Status:  Discontinued       See Hyperspace for full Linked Orders Report.   1,000 mg Oral 3 times per day 10/24/20 1214 10/24/20 1220   10/24/20 1230  cefoTEtan (CEFOTAN) 2 g in sodium chloride 0.9 % 100 mL IVPB        2 g 200 mL/hr over 30 Minutes Intravenous On call to O.R. 10/24/20 1222 10/24/20 1453       Assessment/Plan: s/p Procedure(s): XI ROBOTIC LOW ANTERIOR RESECTION , OMENTAL PEDICLE FLAP WITH OMENTOPEXY, PERFUSION ASSESSMENT USING FIREFLY, TAP BLOCK (N/A) RIGID PROCTOSCOPY (N/A)  Mag 1.8-will supplement with IV Mobilize Advance diet, saline lock IV DC Foley Hopefully home tomorrow   LOS: 1 day    Clovis Riley 10/25/2020

## 2020-10-26 LAB — GLUCOSE, CAPILLARY: Glucose-Capillary: 107 mg/dL — ABNORMAL HIGH (ref 70–99)

## 2020-10-26 LAB — CEA: CEA: 1.1 ng/mL (ref 0.0–4.7)

## 2020-10-26 NOTE — Progress Notes (Signed)
Patient felt like he had a piece of tissue in his throat post procedure. I examined patient today on the floor with his wife present. Piece of tissue becomes visible when he exhales in central oropharynx. He can eat and drink without difficulty but he can not complete a sentence without swallowing during speaking.  Dr. Kae Heller contacted ENT and is setting up an appointment for him to be seen. No note of tissue being visible in intubation note.  Patient to be discharged today

## 2020-10-26 NOTE — Progress Notes (Signed)
Pharmacy Brief Note - Alvimopan (Entereg)  The standing order set for alvimopan (Entereg) now includes an automatic order to discontinue the drug after the patient has had a bowel movement. The change was approved by the [place] and the Medical Executive Committee.   This patient has had bowel movements documented by nursing. Therefore, alvimopan has been discontinued. If there are questions, please contact the pharmacy at [phone].   Thank you- Peggyann Juba, PharmD, BCPS 10/26/2020 7:50 AM

## 2020-10-26 NOTE — Discharge Summary (Signed)
Physician Discharge Summary  Patient ID: Colton Tassin MRN: [id number] DOB/AGE: 05-02-1975 45 y.o.  Admit date: 10/24/2020 Discharge date: 10/26/2020  Admission Diagnoses: sigmoid cancer  Discharge Diagnoses:  Principal Problem:   Cancer of sigmoid colon [facility]) Active Problems:   Pre-diabetes   Cancer of rectosigmoid (colon) ([place])   Discharged Condition: good  Hospital Course: Admitted for routine post-op care following uneventful robotic LAR. Progressed well and was ambulating with excellent pain control, bowel function and tolerating PO intake. Did note a piece of tissue on the soft palate that dangles and protrudes forward when he talks or coughs. This was discussed with on call ENT who recommended no intervention and patient did receive a dose of steroids to reduce swelling. No dysphagia or bleeding.  Will have him follow up with them to reassess this week.    Discharge Exam: Blood pressure 122/89, pulse 83, temperature 97.7 F (36.5 C), resp. rate 18, height 5\' 6"  (1.676 m), weight 89 kg, SpO2 99 %. General appearance: alert and cooperative Resp: unlabored Cardio: regular rate and rhythm GI: soft, non-tender; bowel sounds normal; no masses,  no organomegaly Neurologic: Grossly normal Incision/Wound: OR dressings clean and dry Patient shows photos of his oropharynx with what appears to be a tear just right of midline on the soft palate  Disposition: Discharge disposition: 01-Home or Self Care       Discharge Instructions     Call MD for:   Complete by: As directed    FEVER > 101.5 F  (temperatures < 101.5 F are not significant)   Call MD for:  extreme fatigue   Complete by: As directed    Call MD for:  persistant dizziness or light-headedness   Complete by: As directed    Call MD for:  persistant nausea and vomiting   Complete by: As directed    Call MD for:  redness, tenderness, or signs of infection (pain, swelling, redness, odor or green/yellow discharge  around incision site)   Complete by: As directed    Call MD for:  severe uncontrolled pain   Complete by: As directed    Diet - low sodium heart healthy   Complete by: As directed    Start with a bland diet such as soups, liquids, starchy foods, low fat foods, etc. the first few days at home. Gradually advance to a solid, low-fat, high fiber diet by the end of the first week at home.   Add a fiber supplement to your diet (Metamucil, etc) If you feel full, bloated, or constipated, stay on a full liquid or pureed/blenderized diet for a few days until you feel better and are no longer constipated.   Discharge instructions   Complete by: As directed    See Discharge Instructions If you are not getting better after two weeks or are noticing you are getting worse, contact our office [phone] for further advice.  We may need to adjust your medications, re-evaluate you in the office, send you to the emergency room, or see what other things we can do to help. The clinic staff is available to answer your questions during regular business hours (8:30am-5pm).  Please don't hesitate to call and ask to speak to one of our nurses for clinical concerns.    A surgeon from [facility] is always on call at the hospitals 24 hours/day If you have a medical emergency, go to the nearest emergency room or call 911.   Discharge wound care:   Complete by:  As directed    It is good for closed incisions and even open wounds to be washed every day.  Shower every day.  Short baths are fine.  Wash the incisions and wounds clean with soap & water.    You may leave closed incisions open to air if it is dry.   You may cover the incision with clean gauze & replace it after your daily shower for comfort.  TEGADERM:  You have clear gauze band-aid dressings over your closed incision(s).  Remove the dressings 3 days after surgery, Monday 10/27/2020   Driving Restrictions   Complete by: As directed    You may  drive when: - you are no longer taking narcotic prescription pain medication - you can comfortably wear a seatbelt - you can safely make sudden turns/stops without pain.   Increase activity slowly   Complete by: As directed    Start light daily activities --- self-care, walking, climbing stairs- beginning the day after surgery.  Gradually increase activities as tolerated.  Control your pain to be active.  Stop when you are tired.  Ideally, walk several times a day, eventually an hour a day.   Most people are back to most day-to-day activities in a few weeks.  It takes 4-6 weeks to get back to unrestricted, intense activity. If you can walk 30 minutes without difficulty, it is safe to try more intense activity such as jogging, treadmill, bicycling, low-impact aerobics, swimming, etc. Save the most intensive and strenuous activity for last (Usually 4-8 weeks after surgery) such as sit-ups, heavy lifting, contact sports, etc.  Refrain from any intense heavy lifting or straining until you are off narcotics for pain control.  You will have off days, but things should improve week-by-week. DO NOT PUSH THROUGH PAIN.  Let pain be your guide: If it hurts to do something, don't do it.   Lifting restrictions   Complete by: As directed    If you can walk 30 minutes without difficulty, it is safe to try more intense activity such as jogging, treadmill, bicycling, low-impact aerobics, swimming, etc. Save the most intensive and strenuous activity for last (Usually 4-8 weeks after surgery) such as sit-ups, heavy lifting, contact sports, etc.   Refrain from any intense heavy lifting or straining until you are off narcotics for pain control.  You will have off days, but things should improve week-by-week. DO NOT PUSH THROUGH PAIN.  Let pain be your guide: If it hurts to do something, don't do it.  Pain is your body warning you to avoid that activity for another week until the pain goes down.   May shower / Bathe    Complete by: As directed    May walk up steps   Complete by: As directed    Remove dressing in 72 hours   Complete by: As directed    Make sure all dressings are removed by the third day after surgery.  Leave incisions open to air.  OK to cover incisions with gauze or bandages as desired   Sexual Activity Restrictions   Complete by: As directed    You may have sexual intercourse when it is comfortable. If it hurts to do something, stop.      Allergies as of 10/26/2020   No Known Allergies      Medication List     TAKE these medications    metFORMIN 500 MG tablet Commonly known as: GLUCOPHAGE Take 500 mg by mouth daily.   traMADol 50  MG tablet Commonly known as: ULTRAM Take 1-2 tablets (50-100 mg total) by mouth every 6 (six) hours as needed for moderate pain or severe pain.               Discharge Care Instructions  (From admission, onward)           Start     Ordered   10/24/20 0000  Discharge wound care:       Comments: It is good for closed incisions and even open wounds to be washed every day.  Shower every day.  Short baths are fine.  Wash the incisions and wounds clean with soap & water.    You may leave closed incisions open to air if it is dry.   You may cover the incision with clean gauze & replace it after your daily shower for comfort.  TEGADERM:  You have clear gauze band-aid dressings over your closed incision(s).  Remove the dressings 3 days after surgery, Monday 10/27/2020   10/24/20 1739            Follow-up Information     Michael Boston, MD Follow up in 3 week(s).   Specialties: General Surgery, Colon and Rectal Surgery Why: To follow up after your operation Contact information: [place] [address] [phone]         Skotnicki, Trinda Pascal A, DO. Call in 1 day(s).   Specialty: Otolaryngology Why: Please call to make an appointment to follow up palate injury. Contact information: [address] [place] [address] [phone]                 Signed: Clovis Riley 10/26/2020, 7:41 AM

## 2020-10-26 NOTE — Progress Notes (Signed)
Assessment unchanged. Pt and wife verbalized understanding of dc instructions through teach back including medications and follow up care with Surgeon and ENT. Dr Kalman Shan, anesthesiologist, in to see pt and assess "loose skin flap" in back of throat. Pt to see ENT outpatient. Discharged via foot per pt request accompanied By NT.

## 2020-10-26 NOTE — Anesthesia Postprocedure Evaluation (Signed)
Anesthesia Post Note  Patient: Clifford [name]  Procedure(s) Performed: XI ROBOTIC LOW ANTERIOR RESECTION , OMENTAL PEDICLE FLAP WITH OMENTOPEXY, PERFUSION ASSESSMENT USING FIREFLY, TAP BLOCK (Abdomen) RIGID PROCTOSCOPY (Rectum)     Patient location during evaluation: PACU Anesthesia Type: General Level of consciousness: awake and alert Pain management: pain level controlled Vital Signs Assessment: post-procedure vital signs reviewed and stable Respiratory status: spontaneous breathing, nonlabored ventilation, respiratory function stable and patient connected to nasal cannula oxygen Cardiovascular status: blood pressure returned to baseline and stable Postop Assessment: no apparent nausea or vomiting Anesthetic complications: no   No notable events documented.  Last Vitals:  Vitals:   10/26/20 0156 10/26/20 0547  BP: 131/88 122/89  Pulse: 87 83  Resp: 17 18  Temp: 36.6 C 36.5 C  SpO2: 96% 99%    Last Pain:  Vitals:   10/25/20 2140  TempSrc: Oral  PainSc:                  Amellia Panik S

## 2020-10-26 NOTE — Plan of Care (Signed)

## 2020-10-27 ENCOUNTER — Encounter (HOSPITAL_COMMUNITY): Payer: Self-pay | Admitting: Surgery

## 2020-10-27 LAB — GLUCOSE, CAPILLARY: Glucose-Capillary: 90 mg/dL (ref 70–99)

## 2020-10-28 ENCOUNTER — Telehealth: Payer: Self-pay | Admitting: Hematology

## 2020-10-28 ENCOUNTER — Other Ambulatory Visit: Payer: Self-pay

## 2020-10-28 DIAGNOSIS — C19 Malignant neoplasm of rectosigmoid junction: Secondary | ICD-10-CM

## 2020-10-28 NOTE — Progress Notes (Signed)
Patient s/p  Robotic low anterior rectosigmoid resection for rectosigmoid cancer.  10/22/2020 Tumor was in the mobile rectosigmoid above the peritoneal reflection, so I do not think that post adjuvant radiation would be of much help.  Dx:  pT2pN1 cancer 2/28 LN+  [place]:   1.  Please set the patient up for GI Tumor Board.  Patient Care Team: Elwyn Reach, MD as PCP - General (Internal Medicine) Michael Boston, MD as Consulting Physician (Colon and Rectal Surgery) Carol Ada, MD as Consulting Physician (Gastroenterology)   2.   Please set up outpatient consult(s):  Dr Ammie Dalton or Burr Medico to discuss need for post adjuvant chemotherapy.  Tumor was in the mobile rectosigmoid above the peritoneal reflection, so I do not think that post adjuvant radiation would be of much help.

## 2020-10-28 NOTE — Progress Notes (Signed)
I received an inbasket med/onc referral from Dr Johney Maine.  Referral entered.

## 2020-10-28 NOTE — Telephone Encounter (Signed)
Scheduled appt per 10/11 referral. Pt is aware of appt date and time.  

## 2020-11-04 LAB — SURGICAL PATHOLOGY

## 2020-11-05 ENCOUNTER — Other Ambulatory Visit: Payer: Self-pay

## 2020-11-05 NOTE — Progress Notes (Signed)
The proposed treatment discussed in conference is for discussion purpose only and is not a binding recommendation.  The patients have not been physically examined, or presented with their treatment options.  Therefore, final treatment plans cannot be decided.  

## 2020-11-14 ENCOUNTER — Other Ambulatory Visit (HOSPITAL_COMMUNITY): Payer: Self-pay

## 2020-11-14 ENCOUNTER — Other Ambulatory Visit: Payer: Self-pay

## 2020-11-14 ENCOUNTER — Encounter: Payer: Self-pay | Admitting: Hematology

## 2020-11-14 ENCOUNTER — Inpatient Hospital Stay: Payer: BC Managed Care – PPO | Attending: Hematology | Admitting: Hematology

## 2020-11-14 VITALS — BP 140/92 | HR 74 | Temp 98.2°F | Resp 17 | Ht 66.0 in | Wt 196.2 lb

## 2020-11-14 DIAGNOSIS — E119 Type 2 diabetes mellitus without complications: Secondary | ICD-10-CM | POA: Insufficient documentation

## 2020-11-14 DIAGNOSIS — Z7984 Long term (current) use of oral hypoglycemic drugs: Secondary | ICD-10-CM | POA: Diagnosis not present

## 2020-11-14 DIAGNOSIS — C19 Malignant neoplasm of rectosigmoid junction: Secondary | ICD-10-CM | POA: Diagnosis not present

## 2020-11-14 DIAGNOSIS — C772 Secondary and unspecified malignant neoplasm of intra-abdominal lymph nodes: Secondary | ICD-10-CM | POA: Diagnosis not present

## 2020-11-14 MED ORDER — CAPECITABINE 500 MG PO TABS
ORAL_TABLET | ORAL | 0 refills | Status: DC
  Filled 2020-11-14: qty 98, fill #0

## 2020-11-14 MED ORDER — PROCHLORPERAZINE MALEATE 10 MG PO TABS
10.0000 mg | ORAL_TABLET | Freq: Four times a day (QID) | ORAL | 1 refills | Status: DC | PRN
  Filled 2020-11-14: qty 30, 8d supply, fill #0

## 2020-11-14 MED ORDER — ONDANSETRON HCL 8 MG PO TABS
8.0000 mg | ORAL_TABLET | Freq: Two times a day (BID) | ORAL | 1 refills | Status: DC | PRN
  Filled 2020-11-14: qty 18, 9d supply, fill #0

## 2020-11-14 NOTE — Progress Notes (Signed)
I met with Mr [place] [name] prior to his consultation with Dr Burr Medico.  I explained my role as GI Oncology Nurse Navigator and provided my contact information.  I explained the services available at [facility].  All questions were answered.  He verbalized understanding.

## 2020-11-14 NOTE — Progress Notes (Signed)
[name]   Telephone:(336) [phone] Fax:(336) [place] Note   Patient Care Team: Elwyn Reach, MD as PCP - General (Internal Medicine) Michael Boston, MD as Consulting Physician (Colon and Rectal Surgery) Carol Ada, MD as Consulting Physician (Gastroenterology) Truitt Merle, MD as Consulting Physician (Oncology)  Date of Service:  11/14/2020   CHIEF COMPLAINTS/PURPOSE OF CONSULTATION:  Colon Cancer  REFERRING PHYSICIAN:  Dr. Johney Maine  ASSESSMENT & PLAN:  Clifford [name] is a 45 y.o.  male without significant PMH  1. Cancer of rectosigmoid colon, pT2N1bM0, stage IIIA, MMR intact  -initially presented with hematochezia. Colonoscopy 09/25/20 with Dr. Benson Norway showed 3 cm sigmoid colon mass; path confirmed adenocarcinoma.  -CT CAP same day showed a slightly enlarged right common iliac lymph node 75m, no other evidence of distant metastasis. -he proceeded to resection on 10/24/20 under Dr. GJohney Maine with path showing 3.2 cm invasive adenocarcinoma involving rectosigmoid colon invading into muscularis propria. Two lymph nodes were positive (2/28). Margins were negative. -We discussed the risk of cancer recurrence after surgery. Due to his stage IIIA disease, he is at moderate to high risk of recurrence -We discussed the standard adjuvant chemotherapy for stage III colon cancer,  reduce the risk of cancer recurrence. -we discussed the regimens for adjuvant chemotherapy, including FOLFOX, CAPOX, or single agent Xeloda or 5-FU. Given his young age and excellent performance status, I recommend CAPOX every 3 weeks for 3 months. --Chemotherapy consent: Side effects including but does not not limited to, fatigue, nausea, vomiting, diarrhea, hair loss, neuropathy, fluid retention, renal and kidney dysfunction, neutropenic fever, needed for blood transfusion, bleeding, coronary artery spasm and heart attack, were discussed with patient in great detail. He agrees to  proceed. -The goal of therapy is curative  -He has been recovering well from his surgery. We will plan to begin in a week. -We do not plan to put a port in unless he has access issue   2. Genetics -given his young age, I recommend genetic testing. He is agreeable; I will refer him.   PLAN:  -referral to genetics -chemo class next week -plan to begin Capox next week, 11/21/20, I called in Xeloda today  -lab and f/u in 2 weeks (one week after first cycle chemo) -f/u and C2 oxali in 4 weeks   Oncology History  Cancer of rectosigmoid (colon) ([place]  09/25/2020 Procedure   Colonoscopy, Dr. HBenson Norway Findings: -An infiltrative and ulcerated non-obstructing large mass was found in the sigmoid colon. The mass was non-circumferential. The mass measured 3 cm in length. Oozing was present. This was biopsied with a cold snare for histology. Areas was tattooed.  -Approximately 17 cm from the anal verge a large mass was identified. There was a central ulceration and large rolled edges. The lesion was nonobstructing and it was less than 50% of the circumference. Multiple cold snare biopsies were obtained. The lesion was tattooed proximally and distally.   09/25/2020 Imaging   CT CAP  IMPRESSION: No definite abnormality is noted in the chest.  7 mm right common iliac lymph node is noted concerning for metastatic disease given the history of newly diagnosed colonic malignancy.  No other abnormality seen in the abdomen or pelvis.   09/25/2020 Pathology Results   FINAL DIAGNOSIS:  A. Colon, sigmoid, mass, biopsy  - INVASIVE COLONIC ADENOCARCINOMA, MODERATELY-DIFFERENTIATED.  Addendum: A. Colon, sigmoid, mass, biopsy  - IMMUNOHISTOCHEMICAL STAINS FOR MLH1, MSH2, MSH6, AND PMS2 ARE INTACT (NORMAL)   10/24/2020 Initial  Diagnosis   Cancer of rectosigmoid (colon) ([place])   10/24/2020 Cancer Staging   Staging form: Colon and Rectum, AJCC 8th Edition - Pathologic stage from 10/24/2020: Stage IIIA (pT2,  pN1b, cM0) - Signed by Truitt Merle, MD on 11/14/2020 Histologic grading system: 4 grade system Histologic grade (G): G2 Residual tumor (R): R0 - None    10/24/2020 Definitive Surgery   -XI ROBOTIC LOW ANTERIOR RESECTION , OMENTAL PEDICLE FLAP WITH OMENTOPEXY, PERFUSION ASSESSMENT USING FIREFLY, TAP BLOCK (Abdomen)  -RIGID PROCTOSCOPY (Rectum)   10/24/2020 Pathology Results   FINAL MICROSCOPIC DIAGNOSIS:   A. RECTOSIGMOID COLON, RESECTION:  - Invasive moderately differentiated adenocarcinoma, 3.2 cm, involving rectosigmoid colon  - Carcinoma invades into muscularis propria  - Metastatic carcinoma to two of twenty-eight lymph nodes (2/28)  - Resection margins are negative for carcinoma  - See oncology table   B. FINAL DISTAL MARGIN, EXCISION:  - Colonic donut, negative for carcinoma   C. PERIAORTIC LYMPH NODE, EXCISION:  - Benign fibroadipose tissue  - Lymphoid tissue is not identified       HISTORY OF PRESENTING ILLNESS:  Clifford [name] 45 y.o. male is a here because of colon cancer. The patient was referred by Dr. Johney Maine. The patient presents to the clinic today alone.   He initially presented with hematochezia, first noticed in 07/2020. He denies having any pain or other symptoms. He notes the blood in the stool was intermittent. He had colonoscopy with Dr. Benson Norway on 09/25/20 revealing a 3 cm non-obstructing mass in the sigmoid colon. Pathology from the procedure confirmed invasive adenocarcinoma. CT CAP performed the same day showed a suspicious right common iliac lymph node, no other abnormalities.  He proceeded to resection under Dr. Johney Maine on 10/24/20. Pathology from the procedure showed: invasive adenocarcinoma, 3.2 cm, involving rectosigmoid colon and invasion into muscularis propria. Metastatic carcinoma was seen in two lymph nodes (2/28). Resection margins were negative.   Today the patient notes they felt/feeling prior/after... -he denies pain from recent surgery, notes his  appetite is good. -reports he has lost 6 lbs from his baseline.   He has a PMHx of.... -DM, recent diagnosis -hernia repair   Socially... -he is originally from [place], moved to [place] about 13 years ago. -he works from home for [employer] -he is married with three children, ages 17-7. -he does not smoke, drink, or do any drugs.   REVIEW OF SYSTEMS:   Constitutional: Denies fevers, chills or abnormal night sweats Eyes: Denies blurriness of vision, double vision or watery eyes Ears, nose, mouth, throat, and face: Denies mucositis or sore throat Respiratory: Denies cough, dyspnea or wheezes Cardiovascular: Denies palpitation, chest discomfort or lower extremity swelling Gastrointestinal:  Denies nausea, heartburn or change in bowel habits Skin: Denies abnormal skin rashes Lymphatics: Denies new lymphadenopathy or easy bruising Neurological:Denies numbness, tingling or new weaknesses Behavioral/Psych: Mood is stable, no new changes  All other systems were reviewed with the patient and are negative.   MEDICAL HISTORY:  Past Medical History:  Diagnosis Date   Colon cancer ([place])    Diabetes mellitus without complication ([place])    Pre-diabetes     SURGICAL HISTORY: Past Surgical History:  Procedure Laterality Date   COLON SURGERY  10/24/20   HERNIA REPAIR     PROCTOSCOPY N/A 10/24/2020   Procedure: RIGID PROCTOSCOPY;  Surgeon: Michael Boston, MD;  Location: [facility];  Service: General;  Laterality: N/A;    SOCIAL HISTORY: Social History   Socioeconomic History   Marital status: Married  Spouse name: Not on file   Number of children: 3   Years of education: Not on file   Highest education level: Not on file  Occupational History   Not on file  Tobacco Use   Smoking status: Never   Smokeless tobacco: Never  Vaping Use   Vaping Use: Never used  Substance and Sexual Activity   Alcohol use: Never   Drug use: Never   Sexual activity: Yes    Birth  control/protection: None  Other Topics Concern   Not on file  Social History Narrative   Family originally from [place]   Social Determinants of Health   Financial Resource Strain: Not on file  Food Insecurity: Not on file  Transportation Needs: Not on file  Physical Activity: Not on file  Stress: Not on file  Social Connections: Not on file  Intimate Partner Violence: Not on file    FAMILY HISTORY: Family History  Problem Relation Age of Onset   Diabetes Mother    Diabetes Father     ALLERGIES:  has No Known Allergies.  MEDICATIONS:  Current Outpatient Medications  Medication Sig Dispense Refill   capecitabine (XELODA) 500 MG tablet Take 3 tabs in morning and 4 tabs in evening, every 12 hours, for 14 days then off 7 days. Take after meal 98 tablet 0   metFORMIN (GLUCOPHAGE) 500 MG tablet Take 500 mg by mouth daily.     traMADol (ULTRAM) 50 MG tablet Take 1-2 tablets (50-100 mg total) by mouth every 6 (six) hours as needed for moderate pain or severe pain. 20 tablet 0   No current facility-administered medications for this visit.    PHYSICAL EXAMINATION: ECOG PERFORMANCE STATUS: 1 - Symptomatic but completely ambulatory  Vitals:   11/14/20 1144  BP: (!) 140/92  Pulse: 74  Resp: 17  Temp: 98.2 F (36.8 C)  SpO2: 100%   Filed Weights   11/14/20 1144  Weight: 196 lb 3.2 oz (89 kg)    GENERAL:alert, no distress and comfortable SKIN: skin color, texture, turgor are normal, no rashes or significant lesions EYES: normal, Conjunctiva are pink and non-injected, sclera clear  NECK: supple, thyroid normal size, non-tender, without nodularity LYMPH:  no palpable lymphadenopathy in the cervical, axillary  LUNGS: clear to auscultation and percussion with normal breathing effort HEART: regular rate & rhythm and no murmurs and no lower extremity edema ABDOMEN:abdomen soft, non-tender and normal bowel sounds Musculoskeletal:no cyanosis of digits and no clubbing  NEURO:  alert & oriented x 3 with fluent speech, no focal motor/sensory deficits  LABORATORY DATA:  I have reviewed the data as listed CBC Latest Ref Rng & Units 10/25/2020 09/25/2020  WBC 4.0 - 10.5 K/uL 9.7 8.1  Hemoglobin 13.0 - 17.0 g/dL 12.4(L) 13.4  Hematocrit 39.0 - 52.0 % 39.6 42.8  Platelets 150 - 400 K/uL 225 225    CMP Latest Ref Rng & Units 10/25/2020 09/25/2020  Glucose 70 - 99 mg/dL 137(H) 91  BUN 6 - 20 mg/dL 7 10  Creatinine 0.61 - 1.24 mg/dL 0.97 0.87  Sodium 135 - 145 mmol/L 143 137  Potassium 3.5 - 5.1 mmol/L 4.1 3.7  Chloride 98 - 111 mmol/L 108 102  CO2 22 - 32 mmol/L 26 26  Calcium 8.9 - 10.3 mg/dL 9.4 9.3  Total Protein 6.5 - 8.1 g/dL - 7.4  Total Bilirubin 0.3 - 1.2 mg/dL - 0.8  Alkaline Phos 38 - 126 U/L - 60  AST 15 - 41 U/L - 20  ALT 0 - 44 U/L - 22     RADIOGRAPHIC STUDIES: I have personally reviewed the radiological images as listed and agreed with the findings in the report. No results found.   Orders Placed This Encounter  Procedures   Ambulatory referral to Genetics    Referral Priority:   Routine    Referral Type:   Consultation    Referral Reason:   Specialty Services Required    Number of Visits Requested:   1    All questions were answered. The patient knows to call the clinic with any problems, questions or concerns. The total time spent in the appointment was 60 minutes.     Truitt Merle, MD 11/14/2020   I, Wilburn Mylar, am acting as scribe for Truitt Merle, MD.   I have reviewed the above documentation for accuracy and completeness, and I agree with the above.

## 2020-11-14 NOTE — Progress Notes (Signed)
START ON PATHWAY REGIMEN - Colorectal     A cycle is every 21 days:     Capecitabine      Oxaliplatin   **Always confirm dose/schedule in your pharmacy ordering system**  Patient Characteristics: Postoperative without Neoadjuvant Therapy (Pathologic Staging), Colon, Stage III, Low Risk (pT1-3, pN1) Tumor Location: Colon Therapeutic Status: Postoperative without Neoadjuvant Therapy (Pathologic Staging) AJCC M Category: cM0 AJCC T Category: pT2 AJCC N Category: pN1b AJCC 8 Stage Grouping: IIIA Intent of Therapy: Curative Intent, Discussed with Patient

## 2020-11-15 ENCOUNTER — Other Ambulatory Visit (HOSPITAL_COMMUNITY): Payer: Self-pay

## 2020-11-17 ENCOUNTER — Telehealth: Payer: Self-pay

## 2020-11-17 ENCOUNTER — Other Ambulatory Visit (HOSPITAL_COMMUNITY): Payer: Self-pay

## 2020-11-17 ENCOUNTER — Telehealth: Payer: Self-pay | Admitting: Pharmacist

## 2020-11-17 DIAGNOSIS — C19 Malignant neoplasm of rectosigmoid junction: Secondary | ICD-10-CM

## 2020-11-17 MED ORDER — CAPECITABINE 500 MG PO TABS: ORAL_TABLET | ORAL | 0 refills | Status: DC

## 2020-11-17 NOTE — Telephone Encounter (Addendum)
Oral Chemotherapy Pharmacist Encounter   Spoke with patient today to follow up regarding patient's oral chemotherapy medication: Xeloda (capecitabine)  Discussed with patient that his insurance requires prescription to be filled through [facility]. Provided patient with phone number [phone]) to set up medication shipment. Patient aware that prescription was sent to [facility] this AM, so it may be as early as this afternoon that they are able to set up shipment to patient's home. Patient expressed appreciation and understanding.  Patient knows to call the office with questions or concerns.  Leron Croak, PharmD, BCPS Hematology/Oncology Clinical Pharmacist [facility] [place] [phone] 11/17/2020 12:37 PM

## 2020-11-17 NOTE — Telephone Encounter (Addendum)
Oral Oncology Pharmacist Encounter  Received new prescription for Xeloda (capecitabine) for the adjuvant treatment of stage IIIA colon cancer in conjunction with oxaliplatin, planned duration 3 months.  CBC and BMP from 10/25/20 assessed, no relevant lab abnormalities noted. Prescription dose and frequency assessed for appropriateness.   Current medication list in Epic reviewed, no relevant/significant DDIs with Xeloda identified.  Evaluated chart and no patient barriers to medication adherence noted.   Patient agreement for treatment documented in MD note on 11/14/20.  Patient's insurance requires that Xeloda be filled through [facility]. Prescription redirected for dispensing.   Oral Oncology Clinic will continue to follow for insurance authorization, copayment issues, initial counseling and start date.  Leron Croak, PharmD, BCPS Hematology/Oncology Clinical Pharmacist [facility] and [place] [phone] 11/17/2020 8:22 AM

## 2020-11-17 NOTE — Telephone Encounter (Signed)
Oral Oncology Patient Advocate Encounter   Received notification from [place] that prior authorization for Xeloda is required.   PA submitted on CoverMyMeds Key [id number] Status is pending   Oral Oncology Clinic will continue to follow.   [place] Patient [place] Phone [phone] Fax [phone] 11/17/2020 8:52 AM

## 2020-11-18 ENCOUNTER — Other Ambulatory Visit: Payer: Self-pay | Admitting: Genetic Counselor

## 2020-11-18 ENCOUNTER — Inpatient Hospital Stay: Payer: BC Managed Care – PPO | Attending: Hematology

## 2020-11-18 ENCOUNTER — Encounter: Payer: Self-pay | Admitting: Hematology

## 2020-11-18 ENCOUNTER — Inpatient Hospital Stay: Payer: BC Managed Care – PPO

## 2020-11-18 ENCOUNTER — Inpatient Hospital Stay (HOSPITAL_BASED_OUTPATIENT_CLINIC_OR_DEPARTMENT_OTHER): Payer: BC Managed Care – PPO | Admitting: Genetic Counselor

## 2020-11-18 ENCOUNTER — Other Ambulatory Visit: Payer: Self-pay

## 2020-11-18 DIAGNOSIS — Z79631 Long term (current) use of antimetabolite agent: Secondary | ICD-10-CM | POA: Diagnosis not present

## 2020-11-18 DIAGNOSIS — C772 Secondary and unspecified malignant neoplasm of intra-abdominal lymph nodes: Secondary | ICD-10-CM | POA: Diagnosis not present

## 2020-11-18 DIAGNOSIS — Z79899 Other long term (current) drug therapy: Secondary | ICD-10-CM | POA: Insufficient documentation

## 2020-11-18 DIAGNOSIS — E119 Type 2 diabetes mellitus without complications: Secondary | ICD-10-CM | POA: Insufficient documentation

## 2020-11-18 DIAGNOSIS — Z5111 Encounter for antineoplastic chemotherapy: Secondary | ICD-10-CM | POA: Insufficient documentation

## 2020-11-18 DIAGNOSIS — Z7984 Long term (current) use of oral hypoglycemic drugs: Secondary | ICD-10-CM | POA: Diagnosis not present

## 2020-11-18 DIAGNOSIS — Z1379 Encounter for other screening for genetic and chromosomal anomalies: Secondary | ICD-10-CM

## 2020-11-18 DIAGNOSIS — C19 Malignant neoplasm of rectosigmoid junction: Secondary | ICD-10-CM

## 2020-11-18 LAB — CBC WITH DIFFERENTIAL/PLATELET
Abs Immature Granulocytes: 0.01 10*3/uL (ref 0.00–0.07)
Basophils Absolute: 0 10*3/uL (ref 0.0–0.1)
Basophils Relative: 0 %
Eosinophils Absolute: 0 10*3/uL (ref 0.0–0.5)
Eosinophils Relative: 0 %
HCT: 42 % (ref 39.0–52.0)
Hemoglobin: 13.4 g/dL (ref 13.0–17.0)
Immature Granulocytes: 0 %
Lymphocytes Relative: 40 %
Lymphs Abs: 2 10*3/uL (ref 0.7–4.0)
MCH: 20.8 pg — ABNORMAL LOW (ref 26.0–34.0)
MCHC: 31.9 g/dL (ref 30.0–36.0)
MCV: 65.2 fL — ABNORMAL LOW (ref 80.0–100.0)
Monocytes Absolute: 0.3 10*3/uL (ref 0.1–1.0)
Monocytes Relative: 7 %
Neutro Abs: 2.6 10*3/uL (ref 1.7–7.7)
Neutrophils Relative %: 53 %
Platelets: 259 10*3/uL (ref 150–400)
RBC: 6.44 MIL/uL — ABNORMAL HIGH (ref 4.22–5.81)
RDW: 17 % — ABNORMAL HIGH (ref 11.5–15.5)
Smear Review: NORMAL
WBC: 4.9 10*3/uL (ref 4.0–10.5)
nRBC: 0 % (ref 0.0–0.2)

## 2020-11-18 LAB — CEA (IN HOUSE-CHCC): CEA (CHCC-In House): 1.3 ng/mL (ref 0.00–5.00)

## 2020-11-18 LAB — COMPREHENSIVE METABOLIC PANEL
ALT: 31 U/L (ref 0–44)
AST: 19 U/L (ref 15–41)
Albumin: 5 g/dL (ref 3.5–5.0)
Alkaline Phosphatase: 66 U/L (ref 38–126)
Anion gap: 7 (ref 5–15)
BUN: 10 mg/dL (ref 6–20)
CO2: 29 mmol/L (ref 22–32)
Calcium: 9.6 mg/dL (ref 8.9–10.3)
Chloride: 104 mmol/L (ref 98–111)
Creatinine, Ser: 0.88 mg/dL (ref 0.61–1.24)
GFR, Estimated: >60 mL/min (ref >60)
Glucose, Bld: 88 mg/dL (ref 70–99)
Potassium: 3.8 mmol/L (ref 3.5–5.1)
Sodium: 140 mmol/L (ref 135–145)
Total Bilirubin: 0.6 mg/dL (ref 0.3–1.2)
Total Protein: 8 g/dL (ref 6.5–8.1)

## 2020-11-18 LAB — FERRITIN: Ferritin: 66 ng/mL (ref 24–336)

## 2020-11-18 LAB — GENETIC SCREENING ORDER

## 2020-11-18 NOTE — Telephone Encounter (Signed)
Oral Chemotherapy Pharmacist Encounter  I spoke with patient for overview of: Xeloda (capecitabine) for the adjuvant treatment of stage IIIA colon cancer in conjunction with infusional oxaliplatin, planned duration of 3 months of therapy.   Counseled patient on administration, dosing, side effects, monitoring, drug-food interactions, safe handling, storage, and disposal.  Patient will take Xeloda 500mg  tablets, 3 tablets (1500mg ) by mouth in AM and 4 tabs (2000mg ) by mouth in PM, within 30 minutes of finishing meals, on days 1-14 of each 21 day cycle.   Oxaliplatin will be infused on day 1 of each 21 day cycle.  Xeloda and oxaliplatin start date: 11/21/20  Adverse effects include but are not limited to: fatigue, decreased blood counts, GI upset, diarrhea, mouth sores, and hand-foot syndrome.  Patient will obtain anti diarrheal and alert the office of 4 or more loose stools above baseline.  Reviewed with patient importance of keeping a medication schedule and plan for any missed doses. No barriers to medication adherence identified.  Medication reconciliation performed and medication/allergy list updated.  Insurance authorization for Xeloda has been obtained. Test claim at the pharmacy revealed copayment $0 for 1st fill of Xeloda. Patient's insurance requires this to be filled through [facility]. Patient aware that medication will need to be delivered to his home prior to the morning of 11/21/20. [facility] will [facility] [phone] - phone number to oncology department) to request change in delivery date to overnight AM delivery so it is delivered to his home 11/2 or 11/3.  All questions answered.  [name] voiced understanding and appreciation.   Medication education handout and calendar will be provided to patient in clinic on 11/21/20. Patient knows to call the office with questions or concerns. Oral Chemotherapy Clinic phone number provided to patient.    Leron Croak, PharmD, BCPS Hematology/Oncology Clinical Pharmacist [facility] and [place] [phone] 11/18/2020 9:24 AM

## 2020-11-18 NOTE — Telephone Encounter (Signed)
Oral Oncology Patient Advocate Encounter  Prior Authorization for Xeloda has been approved.    PA# [id number] Effective dates: 11/17/20 through 11/17/21  Patient must fill at [facility] will continue to follow.   [place] Patient [place] Phone [phone] Fax [phone] 11/18/2020 11:30 AM

## 2020-11-18 NOTE — Progress Notes (Signed)
Met with patient and accompanying adult at registration to introduce myself as Arboriculturist and to offer available resources.  Discussed one-time $1000 Radio broadcast assistant to assist with personal expenses while going through treatment. Based on verbal income guidelines, patient states he exceeds the allowed amount.  Also advised I would be the person to apply on his behalf for available copay assistance if insurance leaves him with an OOP amount for treatment drugs.  He has my card for any additional financial questions or concerns.

## 2020-11-18 NOTE — Progress Notes (Signed)
REFERRING PROVIDER: Truitt Merle, MD [place], [place] [address]  PRIMARY PROVIDER:  Elwyn Reach, MD  PRIMARY REASON FOR VISIT:  1. 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 Haack, a 45 y.o. male, was seen for a [place] cancer genetics consultation at the request of Dr. Jonelle [facility]le due to a personal history of cancer.  Mr. Dupras presents to clinic today to discuss the possibility of a hereditary predisposition to cancer, to discuss genetic testing, and to further clarify his future cancer risks, as well as potential cancer risks for family members.   In September 2022, at the age of 65, Mr. Levene was diagnosed with colon cancer.   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 Benson [place]  Findings: -An infiltrative and ulcerated non-obstructing large mass was found in the sigmoid colon. The mass was non-circumferential. The mass measured 3 cm in length. Oozing was present. This was biopsied with a cold snare for histology. Areas was tattooed.  -Approximately 17 cm from the anal verge a large mass was identified. There was a central ulceration and large rolled edges. The lesion was nonobstructing and it was less than 50% of the circumference. Multiple cold snare biopsies were obtained. The lesion was tattooed proximally and distally.   09/25/2020 Imaging   CT CAP  IMPRESSION: No definite abnormality is noted in the chest.  7 mm right common iliac lymph node is noted concerning for metastatic disease given the history of newly diagnosed colonic malignancy.  No other abnormality seen in the abdomen or pelvis.   09/25/2020 Pathology Results   FINAL DIAGNOSIS:  A. Colon, sigmoid, mass, biopsy  - INVASIVE COLONIC ADENOCARCINOMA, MODERATELY-DIFFERENTIATED.  Addendum: A. 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 RECTOSIGMOID COLON, RESECTION:  - Invasive moderately differentiated adenocarcinoma, 3.2 cm, involving rectosigmoid colon  - Carcinoma invades into muscularis propria  - Metastatic carcinoma to two of twenty-eight lymph nodes (2/28)  - Resection margins are negative for carcinoma  - See oncology table   B. FINAL DISTAL MARGIN, EXCISION:  - Colonic donut, negative for carcinoma   C. PERIAORTIC LYMPH NODE, EXCISION:  - Benign fibroadipose tissue  - Lymphoid tissue is not identified    Rectosigmoid cancer ([place])  11/14/2020 Initial Diagnosis   Rectosigmoid cancer ([place])   11/21/2020 -  Chemotherapy   Patient is on Treatment Plan : COLORECTAL Xelox (Capeox) q21d       Past Medical History:  Diagnosis Date   Colon cancer ([place])    Diabetes mellitus without complication ([place])    Pre-diabetes     Past Surgical History:  Procedure Laterality Date   COLON SURGERY  10/24/20   HERNIA REPAIR     PROCTOSCOPY N/A 10/24/2020  Procedure: RIGID PROCTOSCOPY;  Surgeon: Michael Boston, MD;  Location: [facility];  Service: General;  Laterality: N/A;    Social History   Socioeconomic History   Marital status: Married    Spouse name: Not on file   Number of children: 3   Years of education: Not on file   Highest education level: Not on file  Occupational History   Not on  file  Tobacco Use   Smoking status: Never   Smokeless tobacco: Never  Vaping Use   Vaping Use: Never used  Substance and Sexual Activity   Alcohol use: Never   Drug use: Never   Sexual activity: Yes    Birth control/protection: None  Other Topics Concern   Not on file  Social History Narrative   Family originally from [place]   Social Determinants of Health   Financial Resource Strain: Not on file  Food Insecurity: Not on file  Transportation Needs: Not on file  Physical Activity: Not on file  Stress: Not on file  Social Connections: Not on file     FAMILY HISTORY:  We obtained a detailed, 4-generation family history.  Mr. Harmsen is unaware of previous family history of genetic testing for hereditary cancer risks. He reports no family history of cancer and no Ashkenazi Jewish ancestry.     GENETIC COUNSELING ASSESSMENT: Mr. Brafford is a 45 y.o. male with a personal history of cancer which is somewhat suggestive of a hereditary predisposition to cancer given his young age at diagnosis. We, therefore, discussed and recommended the following at today's visit.   DISCUSSION: We discussed that 5 - 10% of cancer is hereditary, with most cases of colon cancer associated with Lynch Syndrome.  There are other genes that can be associated with hereditary colon cancer syndromes.  We discussed that testing is beneficial for several reasons including knowing how to follow individuals after completing their treatment, identifying whether potential treatment options would be beneficial, and understanding if other family members could be at risk for cancer and allowing them to undergo genetic testing.   We reviewed the characteristics, features and inheritance patterns of hereditary cancer syndromes. We also discussed genetic testing, including the appropriate family members to test, the process of testing, insurance coverage and turn-around-time for results. We discussed the implications of a  negative, positive, carrier and/or variant of uncertain significant result. We recommended Mr. Borcherding pursue genetic testing for a panel that includes genes associated with colon cancer.   Mr. Hyser  was offered a common hereditary cancer panel (47 genes) and an expanded pan-cancer panel (84 genes). Mr. Grygiel was informed of the benefits and limitations of each panel, including that expanded pan-cancer panels contain genes that do not have clear management guidelines at this point in time.  We also discussed that as the number of genes included on a panel increases, the chances of variants of uncertain significance increases.  After considering the benefits and limitations of each gene panel, Mr. Raby  elected to have [facility]'s Common Cancer Panel (47 genes).  Based on Mr. Sewell's personal history of cancer, he meets medical criteria for genetic testing. Despite that he meets criteria, he may still have an out of pocket cost. We discussed that if his out of pocket cost for testing is over $100, the laboratory will call and confirm whether he wants to proceed with testing.  If the out of pocket cost of testing is less than $100 he will be billed by the genetic testing laboratory.  PLAN: After considering the risks, benefits, and limitations, Mr. Yu provided informed consent to pursue genetic testing and the blood sample was sent to [facility] for analysis of the Common Cancer Panel (47 genes)+RNA. Results should be available within approximately 2-3 weeks' time, at which point they will be disclosed by telephone to Mr. Zaucha, as will any additional recommendations warranted by these results. Mr. Sjogren will receive a summary of his genetic counseling visit and a copy of his results once available. This information will also be available in Epic.   Mr. Klaus questions were answered to his satisfaction today. Our contact information was provided should additional  questions or concerns arise. Thank you for the referral and allowing [place] to share in the care of your patient.   Lucille Passy, MS, [facility] Genetic Counselor [email or web address] (P) [phone]  The patient was seen for a total of 25 minutes in face-to-face genetic counseling. The patient brought his wife. Drs. Magrinat, Lindi Adie and/or Burr Medico were available to discuss this case as needed.   _______________________________________________________________________ For Office Staff:  Number of people involved in session: 2 Was an Intern/ student involved with case: yes

## 2020-11-19 ENCOUNTER — Other Ambulatory Visit: Payer: Self-pay | Admitting: Hematology

## 2020-11-19 DIAGNOSIS — C19 Malignant neoplasm of rectosigmoid junction: Secondary | ICD-10-CM

## 2020-11-20 MED FILL — Dexamethasone Sodium Phosphate Inj 100 MG/10ML: INTRAMUSCULAR | Qty: 1 | Status: AC

## 2020-11-21 ENCOUNTER — Other Ambulatory Visit: Payer: Self-pay

## 2020-11-21 ENCOUNTER — Inpatient Hospital Stay: Payer: BC Managed Care – PPO

## 2020-11-21 VITALS — BP 134/77 | HR 85 | Temp 98.6°F | Resp 17 | Wt 196.5 lb

## 2020-11-21 DIAGNOSIS — C19 Malignant neoplasm of rectosigmoid junction: Secondary | ICD-10-CM | POA: Diagnosis not present

## 2020-11-21 LAB — COMPREHENSIVE METABOLIC PANEL
ALT: 23 U/L (ref 0–44)
AST: 16 U/L (ref 15–41)
Albumin: 4.4 g/dL (ref 3.5–5.0)
Alkaline Phosphatase: 52 U/L (ref 38–126)
Anion gap: 8 (ref 5–15)
BUN: 12 mg/dL (ref 6–20)
CO2: 26 mmol/L (ref 22–32)
Calcium: 9.2 mg/dL (ref 8.9–10.3)
Chloride: 106 mmol/L (ref 98–111)
Creatinine, Ser: 0.91 mg/dL (ref 0.61–1.24)
GFR, Estimated: >60 mL/min (ref >60)
Glucose, Bld: 131 mg/dL — ABNORMAL HIGH (ref 70–99)
Potassium: 3.9 mmol/L (ref 3.5–5.1)
Sodium: 140 mmol/L (ref 135–145)
Total Bilirubin: 0.4 mg/dL (ref 0.3–1.2)
Total Protein: 7 g/dL (ref 6.5–8.1)

## 2020-11-21 LAB — CBC WITH DIFFERENTIAL/PLATELET
Abs Immature Granulocytes: 0.01 10*3/uL (ref 0.00–0.07)
Basophils Absolute: 0 10*3/uL (ref 0.0–0.1)
Basophils Relative: 0 %
Eosinophils Absolute: 0 10*3/uL (ref 0.0–0.5)
Eosinophils Relative: 1 %
HCT: 39.7 % (ref 39.0–52.0)
Hemoglobin: 12.3 g/dL — ABNORMAL LOW (ref 13.0–17.0)
Immature Granulocytes: 0 %
Lymphocytes Relative: 36 %
Lymphs Abs: 1.6 10*3/uL (ref 0.7–4.0)
MCH: 20.4 pg — ABNORMAL LOW (ref 26.0–34.0)
MCHC: 31 g/dL (ref 30.0–36.0)
MCV: 65.8 fL — ABNORMAL LOW (ref 80.0–100.0)
Monocytes Absolute: 0.2 10*3/uL (ref 0.1–1.0)
Monocytes Relative: 6 %
Neutro Abs: 2.5 10*3/uL (ref 1.7–7.7)
Neutrophils Relative %: 57 %
Platelets: 233 10*3/uL (ref 150–400)
RBC: 6.03 MIL/uL — ABNORMAL HIGH (ref 4.22–5.81)
RDW: 15.6 % — ABNORMAL HIGH (ref 11.5–15.5)
WBC: 4.4 10*3/uL (ref 4.0–10.5)
nRBC: 0 % (ref 0.0–0.2)

## 2020-11-21 MED ORDER — PALONOSETRON HCL INJECTION 0.25 MG/5ML
0.2500 mg | Freq: Once | INTRAVENOUS | Status: AC
  Administered 2020-11-21: 0.25 mg via INTRAVENOUS
  Filled 2020-11-21: qty 5

## 2020-11-21 MED ORDER — DEXTROSE 5 % IV SOLN
Freq: Once | INTRAVENOUS | Status: AC
Start: 2020-11-21 — End: 2020-11-21

## 2020-11-21 MED ORDER — OXALIPLATIN CHEMO INJECTION 100 MG/20ML
130.0000 mg/m2 | Freq: Once | INTRAVENOUS | Status: AC
  Administered 2020-11-21: 265 mg via INTRAVENOUS
  Filled 2020-11-21: qty 53

## 2020-11-21 MED ORDER — SODIUM CHLORIDE 0.9 % IV SOLN
10.0000 mg | Freq: Once | INTRAVENOUS | Status: AC
  Administered 2020-11-21: 10 mg via INTRAVENOUS
  Filled 2020-11-21: qty 10

## 2020-11-21 NOTE — Patient Instructions (Addendum)
[name]  Discharge Instructions: Thank you for choosing [name] to provide your oncology and hematology care.   If you have a lab appointment with the [name], please go directly to the [name] and check in at the registration area.   Wear comfortable clothing and clothing appropriate for easy access to any Portacath or PICC line.   We strive to give you quality time with your provider. You may need to reschedule your appointment if you arrive late (15 or more minutes).  Arriving late affects you and other patients whose appointments are after yours.  Also, if you miss three or more appointments without notifying the office, you may be dismissed from the clinic at the provider's discretion.      For prescription refill requests, have your pharmacy contact our office and allow 72 hours for refills to be completed.    Today you received the following chemotherapy and/or immunotherapy agents: Oxaliplatin      To help prevent nausea and vomiting after your treatment, we encourage you to take your nausea medication as directed.  BELOW ARE SYMPTOMS THAT SHOULD BE REPORTED IMMEDIATELY: *FEVER GREATER THAN 100.4 F (38 C) OR HIGHER *CHILLS OR SWEATING *NAUSEA AND VOMITING THAT IS NOT CONTROLLED WITH YOUR NAUSEA MEDICATION *UNUSUAL SHORTNESS OF BREATH *UNUSUAL BRUISING OR BLEEDING *URINARY PROBLEMS (pain or burning when urinating, or frequent urination) *BOWEL PROBLEMS (unusual diarrhea, constipation, pain near the anus) TENDERNESS IN MOUTH AND THROAT WITH OR WITHOUT PRESENCE OF ULCERS (sore throat, sores in mouth, or a toothache) UNUSUAL RASH, SWELLING OR PAIN  UNUSUAL VAGINAL DISCHARGE OR ITCHING   Items with * indicate a potential emergency and should be followed up as soon as possible or go to the Emergency Department if any problems should occur.  Please show the CHEMOTHERAPY ALERT CARD or IMMUNOTHERAPY ALERT CARD at check-in  to the Emergency Department and triage nurse.  Should you have questions after your visit or need to cancel or reschedule your appointment, please contact [name]  Dept: [phone]  and follow the prompts.  Office hours are 8:00 a.m. to 4:30 p.m. Monday - Friday. Please note that voicemails left after 4:00 p.m. may not be returned until the following business day.  We are closed weekends and major holidays. You have access to a nurse at all times for urgent questions. Please call the main number to the clinic Dept: [phone] and follow the prompts.   For any non-urgent questions, you may also contact your provider using MyChart. We now offer e-Visits for anyone 18 and older to request care online for non-urgent symptoms. For details visit [email or web address].   Also download the MyChart app! Go to the app store, search "MyChart", open the app, select [facility], and log in with your MyChart username and password.  Due to Covid, a mask is required upon entering the hospital/clinic. If you do not have a mask, one will be given to you upon arrival. For doctor visits, patients may have 1 support person aged 18 or older with them. For treatment visits, patients cannot have anyone with them due to current Covid guidelines and our immunocompromised population.   Oxaliplatin Injection What is this medication? OXALIPLATIN (ox AL i PLA tin) is a chemotherapy drug. It targets fast dividing cells, like cancer cells, and causes these cells to die. This medicine is used to treat cancers of the colon and rectum, and many other cancers. This medicine may   be used for other purposes; ask your health care provider or pharmacist if you have questions. COMMON BRAND NAME(S): Eloxatin What should I tell my care team before I take this medication? They need to know if you have any of these conditions: heart disease history of irregular heartbeat liver disease low blood  counts, like white cells, platelets, or red blood cells lung or breathing disease, like asthma take medicines that treat or prevent blood clots tingling of the fingers or toes, or other nerve disorder an unusual or allergic reaction to oxaliplatin, other chemotherapy, other medicines, foods, dyes, or preservatives pregnant or trying to get pregnant breast-feeding How should I use this medication? This drug is given as an infusion into a vein. It is administered in a hospital or clinic by a specially trained health care professional. Talk to your pediatrician regarding the use of this medicine in children. Special care may be needed. Overdosage: If you think you have taken too much of this medicine contact a poison control [name] or emergency room at once. NOTE: This medicine is only for you. Do not share this medicine with others. What if I miss a dose? It is important not to miss a dose. Call your doctor or health care professional if you are unable to keep an appointment. What may interact with this medication? Do not take this medicine with any of the following medications: cisapride dronedarone pimozide thioridazine This medicine may also interact with the following medications: aspirin and aspirin-like medicines certain medicines that treat or prevent blood clots like warfarin, apixaban, dabigatran, and rivaroxaban cisplatin cyclosporine diuretics medicines for infection like acyclovir, adefovir, amphotericin B, bacitracin, cidofovir, foscarnet, ganciclovir, gentamicin, pentamidine, vancomycin NSAIDs, medicines for pain and inflammation, like ibuprofen or naproxen other medicines that prolong the QT interval (an abnormal heart rhythm) pamidronate zoledronic acid This list may not describe all possible interactions. Give your health care provider a list of all the medicines, herbs, non-prescription drugs, or dietary supplements you use. Also tell them if you smoke, drink alcohol,  or use illegal drugs. Some items may interact with your medicine. What should I watch for while using this medication? Your condition will be monitored carefully while you are receiving this medicine. You may need blood work done while you are taking this medicine. This medicine may make you feel generally unwell. This is not uncommon as chemotherapy can affect healthy cells as well as cancer cells. Report any side effects. Continue your course of treatment even though you feel ill unless your healthcare professional tells you to stop. This medicine can make you more sensitive to cold. Do not drink cold drinks or use ice. Cover exposed skin before coming in contact with cold temperatures or cold objects. When out in cold weather wear warm clothing and cover your mouth and nose to warm the air that goes into your lungs. Tell your doctor if you get sensitive to the cold. Do not become pregnant while taking this medicine or for 9 months after stopping it. Women should inform their health care professional if they wish to become pregnant or think they might be pregnant. Men should not father a child while taking this medicine and for 6 months after stopping it. There is potential for serious side effects to an unborn child. Talk to your health care professional for more information. Do not breast-feed a child while taking this medicine or for 3 months after stopping it. This medicine has caused ovarian failure in some women. This medicine may make   it more difficult to get pregnant. Talk to your health care professional if you are concerned about your fertility. This medicine has caused decreased sperm counts in some men. This may make it more difficult to father a child. Talk to your health care professional if you are concerned about your fertility. This medicine may increase your risk of getting an infection. Call your health care professional for advice if you get a fever, chills, or sore throat, or other  symptoms of a cold or flu. Do not treat yourself. Try to avoid being around people who are sick. Avoid taking medicines that contain aspirin, acetaminophen, ibuprofen, naproxen, or ketoprofen unless instructed by your health care professional. These medicines may hide a fever. Be careful brushing or flossing your teeth or using a toothpick because you may get an infection or bleed more easily. If you have any dental work done, tell your dentist you are receiving this medicine. What side effects may I notice from receiving this medication? Side effects that you should report to your doctor or health care professional as soon as possible: allergic reactions like skin rash, itching or hives, swelling of the face, lips, or tongue breathing problems cough low blood counts - this medicine may decrease the number of white blood cells, red blood cells, and platelets. You may be at increased risk for infections and bleeding nausea, vomiting pain, redness, or irritation at site where injected pain, tingling, numbness in the hands or feet signs and symptoms of bleeding such as bloody or black, tarry stools; red or dark brown urine; spitting up blood or brown material that looks like coffee grounds; red spots on the skin; unusual bruising or bleeding from the eyes, gums, or nose signs and symptoms of a dangerous change in heartbeat or heart rhythm like chest pain; dizziness; fast, irregular heartbeat; palpitations; feeling faint or lightheaded; falls signs and symptoms of infection like fever; chills; cough; sore throat; pain or trouble passing urine signs and symptoms of liver injury like dark yellow or brown urine; general ill feeling or flu-like symptoms; light-colored stools; loss of appetite; nausea; right upper belly pain; unusually weak or tired; yellowing of the eyes or skin signs and symptoms of low red blood cells or anemia such as unusually weak or tired; feeling faint or lightheaded; falls signs and  symptoms of muscle injury like dark urine; trouble passing urine or change in the amount of urine; unusually weak or tired; muscle pain; back pain Side effects that usually do not require medical attention (report to your doctor or health care professional if they continue or are bothersome): changes in taste diarrhea gas hair loss loss of appetite mouth sores This list may not describe all possible side effects. Call your doctor for medical advice about side effects. You may report side effects to FDA at 1-800-FDA-1088. Where should I keep my medication? This drug is given in a hospital or clinic and will not be stored at home. NOTE: This sheet is a summary. It may not cover all possible information. If you have questions about this medicine, talk to your doctor, pharmacist, or health care provider.  2022 Elsevier/Gold Standard (2020-09-23 00:00:00)  

## 2020-11-24 ENCOUNTER — Telehealth: Payer: Self-pay

## 2020-11-24 ENCOUNTER — Telehealth: Payer: Self-pay | Admitting: *Deleted

## 2020-11-24 NOTE — Telephone Encounter (Signed)
Called pt to see how he did with his first chemotherapy.  He reports doing well, no N/V, no bowel problems, eating & drinking well, some cold sensitivity but otherwise well.  He knows his next appts & how to reach [place] if needed.

## 2020-11-24 NOTE — Telephone Encounter (Signed)
This RN called pt this AM to follow up after an infusion of oxaliplatin on Friday 11/21/20. Pt reports "feeling fine," no nausea/vomiting, no pain, no diarrhea or constipation, no changes in skin or mouth sores. No changes in energy level and pt reports eating well. Pt reports some degree of cold sensitivity, but also reports he is able to manage at home by eating/drinking warm foods, using an oven mitt when reaching into the fridge, etc. Pt reports no problems taking home medications and has no further questions or concerns at this time. This RN reviewed cold sensitivity precautions with pt and when and who to call with concerns.

## 2020-11-25 ENCOUNTER — Encounter: Payer: Self-pay | Admitting: Hematology

## 2020-11-28 ENCOUNTER — Inpatient Hospital Stay (HOSPITAL_BASED_OUTPATIENT_CLINIC_OR_DEPARTMENT_OTHER): Payer: BC Managed Care – PPO | Admitting: Hematology

## 2020-11-28 ENCOUNTER — Other Ambulatory Visit: Payer: BC Managed Care – PPO

## 2020-11-28 ENCOUNTER — Inpatient Hospital Stay: Payer: BC Managed Care – PPO

## 2020-11-28 ENCOUNTER — Encounter: Payer: Self-pay | Admitting: Hematology

## 2020-11-28 ENCOUNTER — Other Ambulatory Visit: Payer: Self-pay

## 2020-11-28 VITALS — BP 128/79 | HR 78 | Temp 98.3°F | Resp 18 | Ht 66.0 in | Wt 194.6 lb

## 2020-11-28 DIAGNOSIS — C19 Malignant neoplasm of rectosigmoid junction: Secondary | ICD-10-CM

## 2020-11-28 LAB — CBC WITH DIFFERENTIAL/PLATELET
Abs Immature Granulocytes: 0.02 10*3/uL (ref 0.00–0.07)
Basophils Absolute: 0 10*3/uL (ref 0.0–0.1)
Basophils Relative: 0 %
Eosinophils Absolute: 0.1 10*3/uL (ref 0.0–0.5)
Eosinophils Relative: 1 %
HCT: 38.9 % — ABNORMAL LOW (ref 39.0–52.0)
Hemoglobin: 12.3 g/dL — ABNORMAL LOW (ref 13.0–17.0)
Immature Granulocytes: 0 %
Lymphocytes Relative: 31 %
Lymphs Abs: 1.7 10*3/uL (ref 0.7–4.0)
MCH: 20.7 pg — ABNORMAL LOW (ref 26.0–34.0)
MCHC: 31.6 g/dL (ref 30.0–36.0)
MCV: 65.5 fL — ABNORMAL LOW (ref 80.0–100.0)
Monocytes Absolute: 0.5 10*3/uL (ref 0.1–1.0)
Monocytes Relative: 9 %
Neutro Abs: 3.3 10*3/uL (ref 1.7–7.7)
Neutrophils Relative %: 59 %
Platelets: 225 10*3/uL (ref 150–400)
RBC: 5.94 MIL/uL — ABNORMAL HIGH (ref 4.22–5.81)
RDW: 15.3 % (ref 11.5–15.5)
WBC: 5.7 10*3/uL (ref 4.0–10.5)
nRBC: 0 % (ref 0.0–0.2)

## 2020-11-28 LAB — COMPREHENSIVE METABOLIC PANEL
ALT: 41 U/L (ref 0–44)
AST: 24 U/L (ref 15–41)
Albumin: 4.3 g/dL (ref 3.5–5.0)
Alkaline Phosphatase: 58 U/L (ref 38–126)
Anion gap: 8 (ref 5–15)
BUN: 10 mg/dL (ref 6–20)
CO2: 27 mmol/L (ref 22–32)
Calcium: 9.4 mg/dL (ref 8.9–10.3)
Chloride: 105 mmol/L (ref 98–111)
Creatinine, Ser: 0.94 mg/dL (ref 0.61–1.24)
GFR, Estimated: >60 mL/min (ref >60)
Glucose, Bld: 132 mg/dL — ABNORMAL HIGH (ref 70–99)
Potassium: 4.1 mmol/L (ref 3.5–5.1)
Sodium: 140 mmol/L (ref 135–145)
Total Bilirubin: 0.3 mg/dL (ref 0.3–1.2)
Total Protein: 7 g/dL (ref 6.5–8.1)

## 2020-11-28 NOTE — Progress Notes (Signed)
[name]   Telephone:(336) [phone] Fax:(336) [phone]   Clinic Follow up Note   Patient Care Team: Elwyn Reach, MD as PCP - General (Internal Medicine) Michael Boston, MD as Consulting Physician (Colon and Rectal Surgery) Carol Ada, MD as Consulting Physician (Gastroenterology) Truitt Merle, MD as Consulting Physician (Oncology)  Date of Service:  11/28/2020  CHIEF COMPLAINT: f/u of colon cancer  CURRENT THERAPY:  Adjuvant CAPOX, q3weeks, started 11/21/20  ASSESSMENT & PLAN:  Clifford [name] is a 45 y.o. male with   1. Cancer of rectosigmoid colon, pT2N1bM0, stage IIIA, MMR intact  -initially presented with hematochezia. Colonoscopy 09/25/20 with Dr. Benson Norway showed 3 cm sigmoid colon mass; path confirmed adenocarcinoma.  -CT CAP same day showed a slightly enlarged right common iliac lymph node 76m, no other evidence of distant metastasis. -resection on 10/24/20 under Dr. GJohney Maine with path showing 3.2 cm invasive adenocarcinoma involving rectosigmoid colon invading into muscularis propria. Two lymph nodes were positive (2/28). Margins were negative. -he started CAPOX, to be given q3weeks for 3 months, on 11/21/20. He tolerated well with mild cold sensitivity. No other AEs from xeloda so far  -he will return for lab and f/u on 11/23 and receive C2 on 12/12/20.   2. Genetics -given his young age, I recommend genetic testing. He underwent testing on 11/18/20. Results are pending.     PLAN:  -lab and f/u with NP [name] as scheduled 11/23 -C2 Capox 12/12/20 as scheduled -lab, f/u, and C3 in 5 weeks   No problem-specific Assessment & Plan notes found for this encounter.   SUMMARY OF ONCOLOGIC HISTORY: Oncology History  Cancer of [place]osigmoid (colon) ([place]  09/25/2020 Procedure   Colonoscopy, Dr. HBenson Norway Findings: -An infiltrative and ulcerated non-obstructing large mass was found in the sigmoid colon. The mass was non-circumferential. The mass measured 3 cm in  length. Oozing was present. This was biopsied with a cold snare for histology. Areas was tattooed.  -Approximately 17 cm from the anal verge a large mass was identified. There was a central ulceration and large rolled edges. The lesion was nonobstructing and it was less than 50% of the circumference. Multiple cold snare biopsies were obtained. The lesion was tattooed proximally and distally.   09/25/2020 Imaging   CT CAP  IMPRESSION: No definite abnormality is noted in the chest.  7 mm right common iliac lymph node is noted concerning for metastatic disease given the history of newly diagnosed colonic malignancy.  No other abnormality seen in the abdomen or pelvis.   09/25/2020 Pathology Results   FINAL DIAGNOSIS:  A. Colon, sigmoid, mass, biopsy  - INVASIVE COLONIC ADENOCARCINOMA, MODERATELY-DIFFERENTIATED.  Addendum: A. Colon, sigmoid, mass, biopsy  - IMMUNOHISTOCHEMICAL STAINS FOR MLH1, MSH2, MSH6, AND PMS2 ARE INTACT (NORMAL)   10/24/2020 Initial Diagnosis   [place]   10/24/2020 Cancer Staging   Staging form: Colon and Rectum, AJCC 8th Edition - Pathologic stage from 10/24/2020: Stage IIIA (pT2, pN1b, cM0) - Signed by FTruitt Merle MD on 11/14/2020 Histologic grading system: 4 grade system Histologic grade (G): G2 Residual tumor (R): R0 - None    10/24/2020 Definitive Surgery   -XI ROBOTIC LOW ANTERIOR RESECTION , OMENTAL PEDICLE FLAP WITH OMENTOPEXY, PERFUSION ASSESSMENT USING FIREFLY, TAP BLOCK (Abdomen)  -RIGID PROCTOSCOPY (Rectum)   10/24/2020 Pathology Results   FINAL MICROSCOPIC DIAGNOSIS:   A. RECTOSIGMOID COLON, RESECTION:  - Invasive moderately differentiated adenocarcinoma, 3.2 cm, involving rectosigmoid colon  - Carcinoma invades into muscularis propria  -  Metastatic carcinoma to two of twenty-eight lymph nodes (2/28)  - Resection margins are negative for carcinoma  - See oncology table   B. FINAL DISTAL MARGIN, EXCISION:  - Colonic  donut, negative for carcinoma   C. PERIAORTIC LYMPH NODE, EXCISION:  - Benign fibroadipose tissue  - Lymphoid tissue is not identified    Rectosigmoid cancer ([place])  11/14/2020 Initial Diagnosis   Rectosigmoid cancer ([place])   11/21/2020 -  Chemotherapy   Patient is on Treatment Plan : COLORECTAL Xelox (Capeox) q21d        INTERVAL HISTORY:  Clifford [name] is here for a follow up of colon cancer. He was last seen by me on 11/14/20 in consultation. He presents to the clinic alone. He reports he experienced cold sensitivity but otherwise tolerated treatment well.   All other systems were reviewed with the patient and are negative.  MEDICAL HISTORY:  Past Medical History:  Diagnosis Date   Colon cancer ([place])    Diabetes mellitus without complication ([place])    Pre-diabetes     SURGICAL HISTORY: Past Surgical History:  Procedure Laterality Date   COLON SURGERY  10/24/20   HERNIA REPAIR     PROCTOSCOPY N/A 10/24/2020   Procedure: RIGID PROCTOSCOPY;  Surgeon: Michael Boston, MD;  Location: [facility];  Service: General;  Laterality: N/A;    I have reviewed the social history and family history with the patient and they are unchanged from previous note.  ALLERGIES:  has No Known Allergies.  MEDICATIONS:  Current Outpatient Medications  Medication Sig Dispense Refill   capecitabine (XELODA) 500 MG tablet TAKE 3 TABLETS BY MOUTH IN THE MORNING AND 4 TABLETS IN THE EVENING (EVERY 12 HOURS) AFTER MEALS FOR 14 DAYS ON, THEN 7 DAYS OFF. REPEAT. 98 tablet 0   metFORMIN (GLUCOPHAGE) 500 MG tablet Take 500 mg by mouth daily.     ondansetron (ZOFRAN) 8 MG tablet Take 1 tablet (8 mg total) by mouth 2 (two) times daily as needed for refractory nausea/vomiting. Start on day 3 after chemotherapy. 30 tablet 1   prochlorperazine (COMPAZINE) 10 MG tablet Take 1 tablet (10 mg total) by mouth every 6 (six) hours as needed (Nausea or vomiting). 30 tablet 1   traMADol (ULTRAM) 50 MG tablet Take 1-2  tablets (50-100 mg total) by mouth every 6 (six) hours as needed for moderate pain or severe pain. 20 tablet 0   No current facility-administered medications for this visit.    PHYSICAL EXAMINATION: ECOG PERFORMANCE STATUS: 0 - Asymptomatic  Vitals:   11/28/20 1240  BP: 128/79  Pulse: 78  Resp: 18  Temp: 98.3 F (36.8 C)  SpO2: 99%   Wt Readings from Last 3 Encounters:  11/28/20 194 lb 9.6 oz (88.3 kg)  11/21/20 196 lb 8 oz (89.1 kg)  11/14/20 196 lb 3.2 oz (89 kg)     GENERAL:alert, no distress and comfortable SKIN: skin color normal, no rashes or significant lesions EYES: normal, Conjunctiva are pink and non-injected, sclera clear  NEURO: alert & oriented x 3 with fluent speech  LABORATORY DATA:  I have reviewed the data as listed CBC Latest Ref Rng & Units 11/28/2020 11/21/2020 11/18/2020  WBC 4.0 - 10.5 K/uL 5.7 4.4 4.9  Hemoglobin 13.0 - 17.0 g/dL 12.3(L) 12.3(L) 13.4  Hematocrit 39.0 - 52.0 % 38.9(L) 39.7 42.0  Platelets 150 - 400 K/uL 225 233 259     CMP Latest Ref Rng & Units 11/28/2020 11/21/2020 11/18/2020  Glucose 70 - 99 mg/dL 132(H)  131(H) 88  BUN 6 - 20 mg/dL 10 12 10   Creatinine 0.61 - 1.24 mg/dL 0.94 0.91 0.88  Sodium 135 - 145 mmol/L 140 140 140  Potassium 3.5 - 5.1 mmol/L 4.1 3.9 3.8  Chloride 98 - 111 mmol/L 105 106 104  CO2 22 - 32 mmol/L 27 26 29   Calcium 8.9 - 10.3 mg/dL 9.4 9.2 9.6  Total Protein 6.5 - 8.1 g/dL 7.0 7.0 8.0  Total Bilirubin 0.3 - 1.2 mg/dL 0.3 0.4 0.6  Alkaline Phos 38 - 126 U/L 58 52 66  AST 15 - 41 U/L 24 16 19   ALT 0 - 44 U/L 41 23 31      RADIOGRAPHIC STUDIES: I have personally reviewed the radiological images as listed and agreed with the findings in the report. No results found.    No orders of the defined types were placed in this encounter.  All questions were answered. The patient knows to call the clinic with any problems, questions or concerns. No barriers to learning was detected. The total time spent in  the appointment was 20 minutes.     Truitt Merle, MD 11/28/2020   I, Wilburn Mylar, am acting as scribe for Truitt Merle, MD.   I have reviewed the above documentation for accuracy and completeness, and I agree with the above.

## 2020-12-01 ENCOUNTER — Telehealth: Payer: Self-pay | Admitting: Hematology

## 2020-12-01 NOTE — Telephone Encounter (Signed)
Scheduled follow-up appointment per 11/11 los. Patient is aware.

## 2020-12-07 NOTE — Progress Notes (Signed)
[facility]   Telephone:(336) [phone] Fax:(336) [phone]   Clinic Follow up Note   Patient Care Team: Elwyn Reach, MD as PCP - General (Internal Medicine) Michael Boston, MD as Consulting Physician (Colon and Rectal Surgery) Carol Ada, MD as Consulting Physician (Gastroenterology) Truitt Merle, MD as Consulting Physician (Oncology) 12/10/2020  CHIEF COMPLAINT: Follow up colon cancer   SUMMARY OF ONCOLOGIC HISTORY: Oncology History  Cancer of rectosigmoid (colon) ([place])  09/25/2020 Procedure   Colonoscopy, Dr. Benson Norway  Findings: -An infiltrative and ulcerated non-obstructing large mass was found in the sigmoid colon. The mass was non-circumferential. The mass measured 3 cm in length. Oozing was present. This was biopsied with a cold snare for histology. Areas was tattooed.  -Approximately 17 cm from the anal verge a large mass was identified. There was a central ulceration and large rolled edges. The lesion was nonobstructing and it was less than 50% of the circumference. Multiple cold snare biopsies were obtained. The lesion was tattooed proximally and distally.   09/25/2020 Imaging   CT CAP  IMPRESSION: No definite abnormality is noted in the chest.  7 mm right common iliac lymph node is noted concerning for metastatic disease given the history of newly diagnosed colonic malignancy.  No other abnormality seen in the abdomen or pelvis.   09/25/2020 Pathology Results   FINAL DIAGNOSIS:  A. Colon, sigmoid, mass, biopsy  - INVASIVE COLONIC ADENOCARCINOMA, MODERATELY-DIFFERENTIATED.  Addendum: A. Colon, sigmoid, mass, biopsy  - IMMUNOHISTOCHEMICAL STAINS FOR MLH1, MSH2, MSH6, AND PMS2 ARE INTACT (NORMAL)   10/24/2020 Initial Diagnosis   Cancer of rectosigmoid (colon) ([place])   10/24/2020 Cancer Staging   Staging form: Colon and Rectum, AJCC 8th Edition - Pathologic stage from 10/24/2020: Stage IIIA (pT2, pN1b, cM0) - Signed by Truitt Merle, MD on  11/14/2020 Histologic grading system: 4 grade system Histologic grade (G): G2 Residual tumor (R): R0 - None    10/24/2020 Definitive Surgery   -XI ROBOTIC LOW ANTERIOR RESECTION , OMENTAL PEDICLE FLAP WITH OMENTOPEXY, PERFUSION ASSESSMENT USING FIREFLY, TAP BLOCK (Abdomen)  -RIGID PROCTOSCOPY (Rectum)   10/24/2020 Pathology Results   FINAL MICROSCOPIC DIAGNOSIS:   A. RECTOSIGMOID COLON, RESECTION:  - Invasive moderately differentiated adenocarcinoma, 3.2 cm, involving rectosigmoid colon  - Carcinoma invades into muscularis propria  - Metastatic carcinoma to two of twenty-eight lymph nodes (2/28)  - Resection margins are negative for carcinoma  - See oncology table   B. FINAL DISTAL MARGIN, EXCISION:  - Colonic donut, negative for carcinoma   C. PERIAORTIC LYMPH NODE, EXCISION:  - Benign fibroadipose tissue  - Lymphoid tissue is not identified    Rectosigmoid cancer ([place])  11/14/2020 Initial Diagnosis   Rectosigmoid cancer ([place])   11/21/2020 -  Chemotherapy   Patient is on Treatment Plan : COLORECTAL Xelox (Capeox) q21d       CURRENT THERAPY: Adjuvant CAPOX q3 weeks (oxaliplatin on day 1, xeloda 1500 mg AM/2000 mg PM days 1-14, q21 days), started 11/21/20, plan for 3 months   INTERVAL HISTORY: Mr. Dethloff returns for follow up and treatment as scheduled.  He began cycle 1 [place] ox 11/21/2020, last seen 11/11 for toxicity check; at 1 week in he was tolerating well with only mild cold sensitivity no other AE's.  He continued to do well with no other side effects, cold sensitivity resolved after a few days.  Denies residual neuropathy in the absence of cold exposure.  He was able to eat and drink well with good energy level.  Denies  hand/foot syndrome, mucositis, rash, nausea/vomiting, constipation/diarrhea, pain except some tenderness with a seatbelt at the low incision, fever, chills, cough, chest pain, dyspnea, or other new complaints.    MEDICAL HISTORY:  Past Medical History:   Diagnosis Date   Colon cancer ([place])    Diabetes mellitus without complication ([place])    Pre-diabetes     SURGICAL HISTORY: Past Surgical History:  Procedure Laterality Date   COLON SURGERY  10/24/20   HERNIA REPAIR     PROCTOSCOPY N/A 10/24/2020   Procedure: RIGID PROCTOSCOPY;  Surgeon: Michael Boston, MD;  Location: [facility];  Service: General;  Laterality: N/A;    I have reviewed the social history and family history with the patient and they are unchanged from previous note.  ALLERGIES:  has No Known Allergies.  MEDICATIONS:  Current Outpatient Medications  Medication Sig Dispense Refill   capecitabine (XELODA) 500 MG tablet TAKE 3 TABLETS BY MOUTH IN THE MORNING AND 4 TABLETS IN THE EVENING (EVERY 12 HOURS) AFTER MEALS FOR 14 DAYS ON, THEN 7 DAYS OFF. REPEAT. 98 tablet 0   metFORMIN (GLUCOPHAGE) 500 MG tablet Take 500 mg by mouth daily.     ondansetron (ZOFRAN) 8 MG tablet Take 1 tablet (8 mg total) by mouth 2 (two) times daily as needed for refractory nausea/vomiting. Start on day 3 after chemotherapy. 30 tablet 1   prochlorperazine (COMPAZINE) 10 MG tablet Take 1 tablet (10 mg total) by mouth every 6 (six) hours as needed (Nausea or vomiting). 30 tablet 1   traMADol (ULTRAM) 50 MG tablet Take 1-2 tablets (50-100 mg total) by mouth every 6 (six) hours as needed for moderate pain or severe pain. 20 tablet 0   No current facility-administered medications for this visit.    PHYSICAL EXAMINATION: ECOG PERFORMANCE STATUS: 0 - Asymptomatic  Vitals:   12/10/20 1233  BP: 132/84  Pulse: 67  Resp: 17  Temp: (!) 97 F (36.1 C)  SpO2: 100%   Filed Weights   12/10/20 1233  Weight: 198 lb 14.4 oz (90.2 kg)    GENERAL:alert, no distress and comfortable SKIN: no rash. Palms without erythema  EYES: sclera clear LUNGS:  normal breathing effort ABDOMEN:abdomen soft, non-tender and normal bowel sounds, incisions healed well NEURO: alert & oriented x 3 with fluent speech, no focal  motor deficits  LABORATORY DATA:  I have reviewed the data as listed CBC Latest Ref Rng & Units 12/10/2020 11/28/2020 11/21/2020  WBC 4.0 - 10.5 K/uL 4.8 5.7 4.4  Hemoglobin 13.0 - 17.0 g/dL 12.4(L) 12.3(L) 12.3(L)  Hematocrit 39.0 - 52.0 % 40.4 38.9(L) 39.7  Platelets 150 - 400 K/uL 242 225 233     CMP Latest Ref Rng & Units 12/10/2020 11/28/2020 11/21/2020  Glucose 70 - 99 mg/dL 89 132(H) 131(H)  BUN 6 - 20 mg/dL 8 10 12   Creatinine 0.61 - 1.24 mg/dL 0.87 0.94 0.91  Sodium 135 - 145 mmol/L 140 140 140  Potassium 3.5 - 5.1 mmol/L 4.1 4.1 3.9  Chloride 98 - 111 mmol/L 106 105 106  CO2 22 - 32 mmol/L 26 27 26   Calcium 8.9 - 10.3 mg/dL 9.3 9.4 9.2  Total Protein 6.5 - 8.1 g/dL 7.1 7.0 7.0  Total Bilirubin 0.3 - 1.2 mg/dL 0.5 0.3 0.4  Alkaline Phos 38 - 126 U/L 58 58 52  AST 15 - 41 U/L 24 24 16   ALT 0 - 44 U/L 38 41 23      RADIOGRAPHIC STUDIES: I have personally reviewed the radiological  images as listed and agreed with the findings in the report. No results found.   ASSESSMENT & PLAN: Jakayden Cancio is a 45 y.o. male with    1. Cancer of rectosigmoid colon, pT2N1bM0, stage IIIA, MMR intact  -initially presented with hematochezia. Colonoscopy 09/25/20 with Dr. Benson Norway showed 3 cm sigmoid colon mass; path confirmed adenocarcinoma. CT CAP same day showed a slightly enlarged right common iliac lymph node 47m, no other evidence of distant metastasis. -S/p resection on 10/24/20 by Dr. GJohney Maine with path showing 3.2 cm invasive adenocarcinoma involving rectosigmoid colon invading into muscularis propria. Two lymph nodes were positive (2/28). Margins were negative. -due to high recurrence risk in stage III colon cancer, he started adjuvant CAPOX q3weeks for 3 months on 11/21/20. Tolerating well with mild cold sensitivity  -C2 to start 12/12/20    2. Genetics -given his young age, genetic testing was recommended, result is pending  Disposition:  Mr. AHegnerappears stable. He completed  1 cycle adjuvant CAPOX, he tolerated well with mild cold sensitivity only. He was able to recover and function well. There is no clinical evidence of disease recurrence.   Labs reviewed, adequate to proceed with cycle 2 CAPOX 12/12/20 as planned, same dose. F/up in 3 weeks with cycle 3, or sooner if needed.   All questions were answered. The patient knows to call the clinic with any problems, questions or concerns. No barriers to learning were detected.     LAlla Feeling NP 12/10/20

## 2020-12-10 ENCOUNTER — Encounter: Payer: Self-pay | Admitting: Nurse Practitioner

## 2020-12-10 ENCOUNTER — Other Ambulatory Visit: Payer: Self-pay

## 2020-12-10 ENCOUNTER — Inpatient Hospital Stay (HOSPITAL_BASED_OUTPATIENT_CLINIC_OR_DEPARTMENT_OTHER): Payer: BC Managed Care – PPO | Admitting: Nurse Practitioner

## 2020-12-10 ENCOUNTER — Other Ambulatory Visit: Payer: BC Managed Care – PPO

## 2020-12-10 ENCOUNTER — Inpatient Hospital Stay: Payer: BC Managed Care – PPO

## 2020-12-10 VITALS — BP 132/84 | HR 67 | Temp 97.0°F | Resp 17 | Wt 198.9 lb

## 2020-12-10 DIAGNOSIS — C19 Malignant neoplasm of rectosigmoid junction: Secondary | ICD-10-CM

## 2020-12-10 LAB — COMPREHENSIVE METABOLIC PANEL
ALT: 38 U/L (ref 0–44)
AST: 24 U/L (ref 15–41)
Albumin: 4.3 g/dL (ref 3.5–5.0)
Alkaline Phosphatase: 58 U/L (ref 38–126)
Anion gap: 8 (ref 5–15)
BUN: 8 mg/dL (ref 6–20)
CO2: 26 mmol/L (ref 22–32)
Calcium: 9.3 mg/dL (ref 8.9–10.3)
Chloride: 106 mmol/L (ref 98–111)
Creatinine, Ser: 0.87 mg/dL (ref 0.61–1.24)
GFR, Estimated: >60 mL/min (ref >60)
Glucose, Bld: 89 mg/dL (ref 70–99)
Potassium: 4.1 mmol/L (ref 3.5–5.1)
Sodium: 140 mmol/L (ref 135–145)
Total Bilirubin: 0.5 mg/dL (ref 0.3–1.2)
Total Protein: 7.1 g/dL (ref 6.5–8.1)

## 2020-12-10 LAB — CBC WITH DIFFERENTIAL/PLATELET
Abs Immature Granulocytes: 0.01 10*3/uL (ref 0.00–0.07)
Basophils Absolute: 0 10*3/uL (ref 0.0–0.1)
Basophils Relative: 0 %
Eosinophils Absolute: 0 10*3/uL (ref 0.0–0.5)
Eosinophils Relative: 0 %
HCT: 40.4 % (ref 39.0–52.0)
Hemoglobin: 12.4 g/dL — ABNORMAL LOW (ref 13.0–17.0)
Immature Granulocytes: 0 %
Lymphocytes Relative: 40 %
Lymphs Abs: 1.9 10*3/uL (ref 0.7–4.0)
MCH: 20.3 pg — ABNORMAL LOW (ref 26.0–34.0)
MCHC: 30.7 g/dL (ref 30.0–36.0)
MCV: 66 fL — ABNORMAL LOW (ref 80.0–100.0)
Monocytes Absolute: 0.5 10*3/uL (ref 0.1–1.0)
Monocytes Relative: 11 %
Neutro Abs: 2.3 10*3/uL (ref 1.7–7.7)
Neutrophils Relative %: 49 %
Platelets: 242 10*3/uL (ref 150–400)
RBC: 6.12 MIL/uL — ABNORMAL HIGH (ref 4.22–5.81)
RDW: 17.7 % — ABNORMAL HIGH (ref 11.5–15.5)
WBC: 4.8 10*3/uL (ref 4.0–10.5)
nRBC: 0 % (ref 0.0–0.2)

## 2020-12-10 MED FILL — Dexamethasone Sodium Phosphate Inj 100 MG/10ML: INTRAMUSCULAR | Qty: 1 | Status: AC

## 2020-12-12 ENCOUNTER — Telehealth: Payer: Self-pay | Admitting: Hematology

## 2020-12-12 ENCOUNTER — Other Ambulatory Visit: Payer: Self-pay

## 2020-12-12 ENCOUNTER — Inpatient Hospital Stay: Payer: BC Managed Care – PPO

## 2020-12-12 VITALS — BP 157/89 | HR 83 | Temp 98.5°F | Resp 17 | Wt 198.0 lb

## 2020-12-12 DIAGNOSIS — C19 Malignant neoplasm of rectosigmoid junction: Secondary | ICD-10-CM | POA: Diagnosis not present

## 2020-12-12 MED ORDER — OXALIPLATIN CHEMO INJECTION 100 MG/20ML
130.0000 mg/m2 | Freq: Once | INTRAVENOUS | Status: AC
  Administered 2020-12-12: 265 mg via INTRAVENOUS
  Filled 2020-12-12: qty 53

## 2020-12-12 MED ORDER — DEXTROSE 5 % IV SOLN: Freq: Once | INTRAVENOUS | Status: AC

## 2020-12-12 MED ORDER — PALONOSETRON HCL INJECTION 0.25 MG/5ML
0.2500 mg | Freq: Once | INTRAVENOUS | Status: AC
  Administered 2020-12-12: 0.25 mg via INTRAVENOUS
  Filled 2020-12-12: qty 5

## 2020-12-12 MED ORDER — SODIUM CHLORIDE 0.9 % IV SOLN
10.0000 mg | Freq: Once | INTRAVENOUS | Status: AC
  Administered 2020-12-12: 10 mg via INTRAVENOUS
  Filled 2020-12-12: qty 10

## 2020-12-12 NOTE — Patient Instructions (Signed)
[facility] MEDICAL [facility]COLOG[facility]  Discharge Instructions: Thank you for choosing [facility] to provide your oncology and hematology care.   If you have a lab appointment with the Cancer Center, please go directly to the Cancer Center and check in at the registration area.   Wear comfortable clothing and clothing appropriate for easy access to any Portacath or PICC line.   We strive to give you quality time with your provider. You may need to reschedule your appointment if you arrive late (15 or more minutes).  Arriving late affects you and other patients whose appointments are after yours.  Also, if you miss three or more appointments without notifying the office, you may be dismissed from the clinic at the provider's discretion.      For prescription refill requests, have your pharmacy contact our office and allow 72 hours for refills to be completed.    Today you received the following chemotherapy and/or immunotherapy agents: Oxaliplatin.       To help prevent nausea and vomiting after your treatment, we encourage you to take your nausea medication as directed.  BELOW ARE SYMPTOMS THAT SHOULD BE REPORTED IMMEDIATELY: *FEVER GREATER THAN 100.4 F (38 C) OR HIGHER *CHILLS OR SWEATING *NAUSEA AND VOMITING THAT IS NOT CONTROLLED WITH YOUR NAUSEA MEDICATION *UNUSUAL SHORTNESS OF BREATH *UNUSUAL BRUISING OR BLEEDING *URINARY PROBLEMS (pain or burning when urinating, or frequent urination) *BOWEL PROBLEMS (unusual diarrhea, constipation, pain near the anus) TENDERNESS IN MOUTH AND THROAT WITH OR WITHOUT PRESENCE OF ULCERS (sore throat, sores in mouth, or a toothache) UNUSUAL RASH, SWELLING OR PAIN  UNUSUAL VAGINAL DISCHARGE OR ITCHING   Items with * indicate a potential emergency and should be followed up as soon as possible or go to the Emergency Department if any problems should occur.  Please show the CHEMOTHERAPY ALERT CARD or IMMUNOTHERAPY ALERT CARD at check-in  to the Emergency Department and triage nurse.  Should you have questions after your visit or need to cancel or reschedule your appointment, please contact [facility]  Dept: [phone]  and follow the prompts.  Office hours are 8:00 a.m. to 4:30 p.m. Monday - Friday. Please note that voicemails left after 4:00 p.m. may not be returned until the following business day.  We are closed weekends and major holidays. You have access to a nurse at all times for urgent questions. Please call the main number to the clinic Dept: [phone] and follow the prompts.   For any non-urgent questions, you may also contact your provider using MyChart. We now offer e-Visits for anyone 18 and older to request care online for non-urgent symptoms. For details visit [email or web address].   Also download the MyChart app! Go to the app store, search "MyChart", open the app, select [facility], and log in with your MyChart username and password.  Due to Covid, a mask is required upon entering the hospital/clinic. If you do not have a mask, one will be given to you upon arrival. For doctor visits, patients may have 1 support person aged 18 or older with them. For treatment visits, patients cannot have anyone with them due to current Covid guidelines and our immunocompromised population.   

## 2020-12-12 NOTE — Telephone Encounter (Signed)
Scheduled follow-up appointment per 11/23 los. Patient is aware.

## 2020-12-19 ENCOUNTER — Telehealth: Payer: Self-pay | Admitting: Genetic Counselor

## 2020-12-19 ENCOUNTER — Encounter: Payer: Self-pay | Admitting: Genetic Counselor

## 2020-12-19 DIAGNOSIS — Z1379 Encounter for other screening for genetic and chromosomal anomalies: Secondary | ICD-10-CM | POA: Insufficient documentation

## 2020-12-19 NOTE — Telephone Encounter (Signed)
I contacted Mr. Rockett to discuss his genetic testing results. No pathogenic variants were identified in the 47 genes analyzed.    The test report has been scanned into EPIC and is located under the Molecular Pathology section of the Results Review tab.  A portion of the result report is included below for reference. Detailed clinic note to follow.  Lucille Passy, MS, [facility] Genetic Counselor [email or web address] (P) [phone]

## 2020-12-22 ENCOUNTER — Ambulatory Visit: Payer: Self-pay | Admitting: Genetic Counselor

## 2020-12-22 DIAGNOSIS — C19 Malignant neoplasm of rectosigmoid junction: Secondary | ICD-10-CM

## 2020-12-22 NOTE — Progress Notes (Signed)
HPI:   Clifford [name] was previously seen in the [facility] clinic due to a personal history of cancer and concerns regarding a hereditary predisposition to cancer. Please refer to our prior cancer genetics clinic note for more information regarding our discussion, assessment and recommendations, at the time. Clifford [name] recent genetic test results were disclosed to him, as were recommendations warranted by these results. These results and recommendations are discussed in more detail below.  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 Benson [name]  Findings: -An infiltrative and ulcerated non-obstructing large mass was found in the sigmoid colon. The mass was non-circumferential. The mass measured 3 cm in length. Oozing was present. This was biopsied with a cold snare for histology. Areas was tattooed.  -Approximately 17 cm from the anal verge a large mass was identified. There was a central ulceration and large rolled edges. The lesion was nonobstructing and it was less than 50% of the circumference. Multiple cold snare biopsies were obtained. The lesion was tattooed proximally and distally.   09/25/2020 Imaging   CT CAP  IMPRESSION: No definite abnormality is noted in the chest.  7 mm right common iliac lymph node is noted concerning for metastatic disease given the history of newly diagnosed colonic malignancy.  No other abnormality seen in the abdomen or pelvis.   09/25/2020 Pathology Results   FINAL DIAGNOSIS:  A. Colon, sigmoid, mass, biopsy  - INVASIVE COLONIC ADENOCARCINOMA, MODERATELY-DIFFERENTIATED.  Addendum: A. 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 RECTOSIGMOID COLON, RESECTION:  - Invasive moderately differentiated adenocarcinoma, 3.2 cm, involving rectosigmoid colon  - Carcinoma invades into muscularis propria  - Metastatic carcinoma to two of twenty-eight lymph nodes (2/28)  - Resection margins are negative for carcinoma  - See oncology table   B. FINAL DISTAL MARGIN, EXCISION:  - Colonic donut, negative for carcinoma   C. PERIAORTIC LYMPH NODE, EXCISION:  - Benign fibroadipose tissue  - Lymphoid tissue is not identified     Genetic Testing   [employer] Common Cancer Panel is Negative. Report date was 12/16/2020.  The Common Hereditary Cancers + RNA Panel offered by [employer] includes sequencing, deletion/duplication, and RNA testing of the following 47 genes: APC, ATM, AXIN2, BARD1, BMPR1A, BRCA1, BRCA2, BRIP1, CDH1, CDK4*, CDKN2A (p14ARF)*, CDKN2A (p16INK4a)*, CHEK2, CTNNA1, DICER1, EPCAM (Deletion/duplication testing only), GREM1 (promoter region deletion/duplication testing only), KIT, MEN1, MLH1, MSH2, MSH3, MSH6, MUTYH, NBN, NF1, NHTL1, PALB2, PDGFRA*, PMS2, POLD1, POLE, PTEN, RAD50, RAD51C, RAD51D, SDHB, SDHC, SDHD, SMAD4, SMARCA4. STK11, TP53, TSC1, TSC2, and VHL.  The following genes were evaluated for sequence changes only: SDHA and HOXB13 c.251G>A variant only.  RNA analysis is not performed for the * genes.     Rectosigmoid cancer ([place])  11/14/2020 Initial Diagnosis   Rectosigmoid cancer ([place])  11/21/2020 -  Chemotherapy   Patient is on Treatment Plan : COLORECTAL Xelox (Capeox) q21d        FAMILY HISTORY:  We obtained a detailed, 4-generation family history.  Clifford [name] is unaware of previous family history of genetic testing for hereditary cancer risks. He reports no family history of cancer and no Ashkenazi Jewish ancestry.       GENETIC TEST RESULTS:  The [employer] Common Cancer Panel found no pathogenic mutations.   The Common Hereditary Cancers + RNA Panel offered by [facility] includes sequencing, deletion/duplication, and RNA testing of the following 47 genes: APC, ATM, AXIN2, BARD1, BMPR1A, BRCA1, BRCA2, BRIP1, CDH1, CDK4*, CDKN2A (p14ARF)*, CDKN2A (p16INK4a)*, CHEK2, CTNNA1, DICER1, EPCAM (Deletion/duplication testing only), GREM1 (promoter region deletion/duplication testing only), KIT, MEN1, MLH1, MSH2, MSH3, MSH6, MUTYH, NBN, NF1, NHTL1, PALB2, PDGFRA*, PMS2, POLD1, POLE, PTEN, RAD50, RAD51C, RAD51D, SDHB, SDHC, SDHD, SMAD4, SMARCA4. STK11, TP53, TSC1, TSC2, and VHL.  The following genes were evaluated for sequence changes only: SDHA and HOXB13 c.251G>A variant only.  RNA analysis is not performed for the * genes.     The test report has [name] scanned into EPIC and is located under the Molecular Pathology section of the Results Review tab.  A portion of the result report is included below for reference. Genetic testing reported out on 12/16/2020.       Even though a pathogenic variant was not identified, possible explanations for his personal history of cancer may include: There may be no hereditary risk for cancer in the family. Clifford [name] cancer may be due to other genetic or environmental factors. There may be a gene mutation in one of these genes that current testing methods cannot detect, but that chance is small. There could be another gene that has not yet [name] discovered, or that we have not yet tested, that is responsible for the cancer diagnoses in the family.   Therefore, it is important to remain in touch with cancer genetics in the future so that we  can continue to offer Clifford [name] the most up to date genetic testing.   ADDITIONAL GENETIC TESTING:  We discussed with Clifford [name] that his genetic testing was fairly extensive.  If there are genes identified to increase cancer risk that can be analyzed in the future, we would be happy to discuss and coordinate this testing at that time.    CANCER SCREENING RECOMMENDATIONS:  Clifford [name] test result is considered negative (normal).  This means that we have not identified a hereditary cause for his personal history of cancer at this time. Most cancers happen by chance and this negative test suggests that his cancer may fall into this category.    An individual's cancer risk and medical management are not determined by genetic test results alone. Overall cancer risk assessment incorporates additional factors, including personal medical history, family history, and any available genetic information that may result in a personalized plan for cancer prevention and surveillance. Therefore, it is recommended he continue to follow the cancer management and screening guidelines provided by his oncology and primary healthcare provider.  Based on the reported personal and family history, specific cancer screenings for Clifford [name] and his family include:  Colon Cancer Screening: Due to Mr. Wanamaker's personal history of colon cancer, his children are recommended to begin colonoscopies at age 73 (33 years prior to his age at diagnosis) and repeat every 5 years. More frequent colonoscopies may be recommended if polyps are identified.  RECOMMENDATIONS FOR FAMILY  MEMBERS:   Since he did not inherit a mutation in a cancer predisposition gene included on this panel, his children could not have inherited a mutation from him in one of these genes.  FOLLOW-UP:  Cancer genetics is a rapidly advancing field and it is possible that new genetic tests will be appropriate for him and/or his family members in  the future. We encouraged him to remain in contact with cancer genetics on an annual basis so we can update his personal and family histories and let him know of advances in cancer genetics that may benefit this family.   Our contact number was provided. Mr. Monger questions were answered to his satisfaction, and he knows he is welcome to call us at anytime with additional questions or concerns.   Lucille Passy, MS, [facility] Genetic Counselor [email or web address] (P) [phone]

## 2020-12-25 ENCOUNTER — Other Ambulatory Visit: Payer: Self-pay | Admitting: Hematology

## 2020-12-25 DIAGNOSIS — C19 Malignant neoplasm of rectosigmoid junction: Secondary | ICD-10-CM

## 2020-12-26 ENCOUNTER — Encounter: Payer: Self-pay | Admitting: Hematology

## 2021-01-01 MED FILL — Dexamethasone Sodium Phosphate Inj 100 MG/10ML: INTRAMUSCULAR | Qty: 1 | Status: AC

## 2021-01-02 ENCOUNTER — Inpatient Hospital Stay: Payer: BC Managed Care – PPO

## 2021-01-02 ENCOUNTER — Other Ambulatory Visit: Payer: Self-pay

## 2021-01-02 ENCOUNTER — Inpatient Hospital Stay: Payer: BC Managed Care – PPO | Attending: Hematology

## 2021-01-02 ENCOUNTER — Inpatient Hospital Stay (HOSPITAL_BASED_OUTPATIENT_CLINIC_OR_DEPARTMENT_OTHER): Payer: BC Managed Care – PPO | Admitting: Hematology

## 2021-01-02 ENCOUNTER — Encounter: Payer: Self-pay | Admitting: Hematology

## 2021-01-02 VITALS — BP 143/84 | HR 88 | Temp 98.9°F | Resp 19 | Ht 66.0 in | Wt 197.8 lb

## 2021-01-02 DIAGNOSIS — E119 Type 2 diabetes mellitus without complications: Secondary | ICD-10-CM | POA: Insufficient documentation

## 2021-01-02 DIAGNOSIS — C19 Malignant neoplasm of rectosigmoid junction: Secondary | ICD-10-CM | POA: Insufficient documentation

## 2021-01-02 DIAGNOSIS — Z5111 Encounter for antineoplastic chemotherapy: Secondary | ICD-10-CM | POA: Insufficient documentation

## 2021-01-02 DIAGNOSIS — Z79899 Other long term (current) drug therapy: Secondary | ICD-10-CM | POA: Diagnosis not present

## 2021-01-02 DIAGNOSIS — C772 Secondary and unspecified malignant neoplasm of intra-abdominal lymph nodes: Secondary | ICD-10-CM | POA: Diagnosis not present

## 2021-01-02 LAB — COMPREHENSIVE METABOLIC PANEL
ALT: 100 U/L — ABNORMAL HIGH (ref 0–44)
AST: 41 U/L (ref 15–41)
Albumin: 4.3 g/dL (ref 3.5–5.0)
Alkaline Phosphatase: 65 U/L (ref 38–126)
Anion gap: 9 (ref 5–15)
BUN: 9 mg/dL (ref 6–20)
CO2: 26 mmol/L (ref 22–32)
Calcium: 9.3 mg/dL (ref 8.9–10.3)
Chloride: 107 mmol/L (ref 98–111)
Creatinine, Ser: 0.91 mg/dL (ref 0.61–1.24)
GFR, Estimated: >60 mL/min (ref >60)
Glucose, Bld: 150 mg/dL — ABNORMAL HIGH (ref 70–99)
Potassium: 4 mmol/L (ref 3.5–5.1)
Sodium: 142 mmol/L (ref 135–145)
Total Bilirubin: 0.4 mg/dL (ref 0.3–1.2)
Total Protein: 7.1 g/dL (ref 6.5–8.1)

## 2021-01-02 LAB — CBC WITH DIFFERENTIAL/PLATELET
Abs Immature Granulocytes: 0.01 10*3/uL (ref 0.00–0.07)
Basophils Absolute: 0 10*3/uL (ref 0.0–0.1)
Basophils Relative: 0 %
Eosinophils Absolute: 0 10*3/uL (ref 0.0–0.5)
Eosinophils Relative: 0 %
HCT: 39.7 % (ref 39.0–52.0)
Hemoglobin: 12.5 g/dL — ABNORMAL LOW (ref 13.0–17.0)
Immature Granulocytes: 0 %
Lymphocytes Relative: 41 %
Lymphs Abs: 1.8 10*3/uL (ref 0.7–4.0)
MCH: 20.9 pg — ABNORMAL LOW (ref 26.0–34.0)
MCHC: 31.5 g/dL (ref 30.0–36.0)
MCV: 66.4 fL — ABNORMAL LOW (ref 80.0–100.0)
Monocytes Absolute: 0.5 10*3/uL (ref 0.1–1.0)
Monocytes Relative: 11 %
Neutro Abs: 2.1 10*3/uL (ref 1.7–7.7)
Neutrophils Relative %: 48 %
Platelets: 220 10*3/uL (ref 150–400)
RBC: 5.98 MIL/uL — ABNORMAL HIGH (ref 4.22–5.81)
RDW: 19.2 % — ABNORMAL HIGH (ref 11.5–15.5)
WBC: 4.3 10*3/uL (ref 4.0–10.5)
nRBC: 0 % (ref 0.0–0.2)

## 2021-01-02 MED ORDER — DEXTROSE 5 % IV SOLN: Freq: Once | INTRAVENOUS | Status: AC

## 2021-01-02 MED ORDER — SODIUM CHLORIDE 0.9 % IV SOLN
10.0000 mg | Freq: Once | INTRAVENOUS | Status: AC
  Administered 2021-01-02: 10 mg via INTRAVENOUS
  Filled 2021-01-02: qty 10

## 2021-01-02 MED ORDER — CAPECITABINE 500 MG PO TABS: ORAL_TABLET | ORAL | 0 refills | Status: DC

## 2021-01-02 MED ORDER — OXALIPLATIN CHEMO INJECTION 100 MG/20ML
130.0000 mg/m2 | Freq: Once | INTRAVENOUS | Status: AC
  Administered 2021-01-02: 265 mg via INTRAVENOUS
  Filled 2021-01-02: qty 53

## 2021-01-02 MED ORDER — PALONOSETRON HCL INJECTION 0.25 MG/5ML
0.2500 mg | Freq: Once | INTRAVENOUS | Status: AC
  Administered 2021-01-02: 0.25 mg via INTRAVENOUS
  Filled 2021-01-02: qty 5

## 2021-01-02 NOTE — Progress Notes (Signed)
Ok to treat with elevated AST per Dr. Feng 

## 2021-01-02 NOTE — Progress Notes (Signed)
Clifford [name]   Telephone:(336) [phone] Fax:(336) [phone]   Clinic Follow up Note   Patient Care Team: Elwyn Reach, MD as PCP - General (Internal Medicine) Michael Boston, MD as Consulting Physician (Colon and Rectal Surgery) Carol Ada, MD as Consulting Physician (Gastroenterology) Truitt Merle, MD as Consulting Physician (Oncology)  Date of Service:  01/02/2021  CHIEF COMPLAINT: f/u of colon cancer  CURRENT THERAPY:  Adjuvant CAPOX q3 weeks (oxaliplatin on day 1, xeloda 1500 mg AM/2000 mg PM days 1-14, q21 days), started 11/21/20, plan for 3 months   ASSESSMENT & PLAN:  Clifford [name] is a 45 y.o. male with   1. Cancer of rectosigmoid colon, pT2N1bM0, stage IIIA, MMR intact  -initially presented with hematochezia. Colonoscopy 09/25/20 with Dr. Benson Norway showed 3 cm sigmoid colon mass; path confirmed adenocarcinoma.  -CT CAP same day showed a slightly enlarged right common iliac lymph node 58m, no other evidence of distant metastasis. -resection on 10/24/20 under Dr. GJohney Maine with path showing 3.2 cm invasive adenocarcinoma involving rectosigmoid colon invading into muscularis propria. Two lymph nodes were positive (2/28). Margins were negative. -he started CAPOX, to be given q3weeks for 3 months, on 11/21/20. He tolerated well with mild cold sensitivity. No other AEs from xeloda so far    2. Genetics -given his young age, genetic testing was recommended. He underwent testing on 11/18/20. Results were negative.     PLAN:  -proceed with C3 CAPOX today -lab, f/u, and C4 in 3 weeks as scheduled   No problem-specific Assessment & Plan notes found for this encounter.   SUMMARY OF ONCOLOGIC HISTORY: Oncology History  [place]  09/25/2020 Procedure   Colonoscopy, Dr. HBenson Norway Findings: -An infiltrative and ulcerated non-obstructing large mass was found in the sigmoid colon. The mass was non-circumferential. The mass measured 3 cm in length.  Oozing was present. This was biopsied with a cold snare for histology. Areas was tattooed.  -Approximately 17 cm from the anal verge a large mass was identified. There was a central ulceration and large rolled edges. The lesion was nonobstructing and it was less than 50% of the circumference. Multiple cold snare biopsies were obtained. The lesion was tattooed proximally and distally.   09/25/2020 Imaging   CT CAP  IMPRESSION: No definite abnormality is noted in the chest.  7 mm right common iliac lymph node is noted concerning for metastatic disease given the history of newly diagnosed colonic malignancy.  No other abnormality seen in the abdomen or pelvis.   09/25/2020 Pathology Results   FINAL DIAGNOSIS:  A. Colon, sigmoid, mass, biopsy  - INVASIVE COLONIC ADENOCARCINOMA, MODERATELY-DIFFERENTIATED.  Addendum: A. Colon, sigmoid, mass, biopsy  - IMMUNOHISTOCHEMICAL STAINS FOR MLH1, MSH2, MSH6, AND PMS2 ARE INTACT (NORMAL)   10/24/2020 Initial Diagnosis   Can[place]oid ([place]) [place]   10/24/2020 Cancer Staging   Staging form: Colon and Rectum, AJCC 8th Edition - Pathologic stage from 10/24/2020: Stage IIIA (pT2, pN1b, cM0) - Signed by FTruitt Merle MD on 11/14/2020 Histologic grading system: 4 grade system Histologic grade (G): G2 Residual tumor (R): R0 - None    10/24/2020 Definitive Surgery   -XI ROBOTIC LOW ANTERIOR RESECTION , OMENTAL PEDICLE FLAP WITH OMENTOPEXY, PERFUSION ASSESSMENT USING FIREFLY, TAP BLOCK (Abdomen)  -RIGID PROCTOSCOPY (Rectum)   10/24/2020 Pathology Results   FINAL MICROSCOPIC DIAGNOSIS:   A. RECTOSIGMOID COLON, RESECTION:  - Invasive moderately differentiated adenocarcinoma, 3.2 cm, involving rectosigmoid colon  - Carcinoma invades into muscularis propria  -  Metastatic carcinoma to two of twenty-eight lymph nodes (2/28)  - Resection margins are negative for carcinoma  - See oncology table   B. FINAL DISTAL MARGIN, EXCISION:  - Colonic donut,  negative for carcinoma   C. PERIAORTIC LYMPH NODE, EXCISION:  - Benign fibroadipose tissue  - Lymphoid tissue is not identified     Genetic Testing   [employer]ae Common Cancer Panel is Negative. Report date was 12/16/2020.  The Common Hereditary Cancers + RNA Panel offered by [facility] includes sequencing, deletion/duplication, and RNA testing of the following 47 genes: APC, ATM, AXIN2, BARD1, BMPR1A, BRCA1, BRCA2, BRIP1, CDH1, CDK4*, CDKN2A (p14ARF)*, CDKN2A (p16INK4a)*, CHEK2, CTNNA1, DICER1, EPCAM (Deletion/duplication testing only), GREM1 (promoter region deletion/duplication testing only), KIT, MEN1, MLH1, MSH2, MSH3, MSH6, MUTYH, NBN, NF1, NHTL1, PALB2, PDGFRA*, PMS2, POLD1, POLE, PTEN, RAD50, RAD51C, RAD51D, SDHB, SDHC, SDHD, SMAD4, SMARCA4. STK11, TP53, TSC1, TSC2, and VHL.  The following genes were evaluated for sequence changes only: SDHA and HOXB13 c.251G>A variant only.  RNA analysis is not performed for the * genes.     Rectosigmoid cancer ([place])  11/14/2020 Initial Diagnosis   Rectosigmoid cancer ([place])   11/21/2020 -  Chemotherapy   Patient is on Treatment Plan : COLORECTAL Xelox (Capeox) q21d        INTERVAL HISTORY:  Clifford [name] is here for a follow up of colon cancer. He was last seen by NP [name] on 12/10/20. He presents to the clinic alone. He reports he continues to tolerate treatment well overall, with fatigue and cold sensitivity. He denies skin toxicity, swelling, cough, or shortness of breath.   All other systems were reviewed with the patient and are negative.  MEDICAL HISTORY:  Past Medical History:  Diagnosis Date   Colon cancer ([place])    Diabetes mellitus without complication ([place])    Pre-diabetes     SURGICAL HISTORY: Past Surgical History:  Procedure Laterality Date   COLON SURGERY  10/24/20   HERNIA REPAIR     PROCTOSCOPY N/A 10/24/2020   Procedure: RIGID PROCTOSCOPY;  Surgeon: Michael Boston, MD;  Location: [facility];  Service: General;  Laterality:  N/A;    I have reviewed the social history and family history with the patient and they are unchanged from previous note.  ALLERGIES:  has No Known Allergies.  MEDICATIONS:  Current Outpatient Medications  Medication Sig Dispense Refill   capecitabine (XELODA) 500 MG tablet TAKE 3 TABLETS BY MOUTH IN THE MORNING AND 4 TABLETS IN THE EVENING (12 HOURS APART AFTER MEALS) FOR 14 DAYS ON, THEN 7 DAYS OFF, AND THEN REPEAT. 98 tablet 0   metFORMIN (GLUCOPHAGE) 500 MG tablet Take 500 mg by mouth daily.     ondansetron (ZOFRAN) 8 MG tablet Take 1 tablet (8 mg total) by mouth 2 (two) times daily as needed for refractory nausea/vomiting. Start on day 3 after chemotherapy. 30 tablet 1   prochlorperazine (COMPAZINE) 10 MG tablet Take 1 tablet (10 mg total) by mouth every 6 (six) hours as needed (Nausea or vomiting). 30 tablet 1   traMADol (ULTRAM) 50 MG tablet Take 1-2 tablets (50-100 mg total) by mouth every 6 (six) hours as needed for moderate pain or severe pain. 20 tablet 0   No current facility-administered medications for this visit.    PHYSICAL EXAMINATION: ECOG PERFORMANCE STATUS: 1 - Symptomatic but completely ambulatory  Vitals:   01/02/21 1153  BP: (!) 143/84  Pulse: 88  Resp: 19  Temp: 98.9 F (37.2 C)  SpO2: 100%   Wt  Readings from Last 3 Encounters:  01/02/21 197 lb 12.8 oz (89.7 kg)  12/12/20 198 lb (89.8 kg)  12/10/20 198 lb 14.4 oz (90.2 kg)     GENERAL:alert, no distress and comfortable SKIN: skin color normal, no rashes or significant lesions EYES: normal, Conjunctiva are pink and non-injected, sclera clear  NEURO: alert & oriented x 3 with fluent speech  LABORATORY DATA:  I have reviewed the data as listed CBC Latest Ref Rng & Units 01/02/2021 12/10/2020 11/28/2020  WBC 4.0 - 10.5 K/uL 4.3 4.8 5.7  Hemoglobin 13.0 - 17.0 g/dL 12.5(L) 12.4(L) 12.3(L)  Hematocrit 39.0 - 52.0 % 39.7 40.4 38.9(L)  Platelets 150 - 400 K/uL 220 242 225     CMP Latest Ref Rng &  Units 12/10/2020 11/28/2020 11/21/2020  Glucose 70 - 99 mg/dL 89 132(H) 131(H)  BUN 6 - 20 mg/dL 8 10 12   Creatinine 0.61 - 1.24 mg/dL 0.87 0.94 0.91  Sodium 135 - 145 mmol/L 140 140 140  Potassium 3.5 - 5.1 mmol/L 4.1 4.1 3.9  Chloride 98 - 111 mmol/L 106 105 106  CO2 22 - 32 mmol/L 26 27 26   Calcium 8.9 - 10.3 mg/dL 9.3 9.4 9.2  Total Protein 6.5 - 8.1 g/dL 7.1 7.0 7.0  Total Bilirubin 0.3 - 1.2 mg/dL 0.5 0.3 0.4  Alkaline Phos 38 - 126 U/L 58 58 52  AST 15 - 41 U/L 24 24 16   ALT 0 - 44 U/L 38 41 23      RADIOGRAPHIC STUDIES: I have personally reviewed the radiological images as listed and agreed with the findings in the report. No results found.    No orders of the defined types were placed in this encounter.  All questions were answered. The patient knows to call the clinic with any problems, questions or concerns. No barriers to learning was detected.      Truitt Merle, MD 01/02/2021   I, Wilburn Mylar, am acting as scribe for Truitt Merle, MD.   I have reviewed the above documentation for accuracy and completeness, and I agree with the above.

## 2021-01-02 NOTE — Patient Instructions (Signed)
[facility] MEDICAL ONCOLOGY   ?Discharge Instructions: ?Thank you for choosing [facility] to provide your oncology and hematology care.  ? ?If you have a lab appointment with the Cancer Center, please go directly to the Cancer Center and check in at the registration area. ?  ?Wear comfortable clothing and clothing appropriate for easy access to any Portacath or PICC line.  ? ?We strive to give you quality time with your provider. You may need to reschedule your appointment if you arrive late (15 or more minutes).  Arriving late affects you and other patients whose appointments are after yours.  Also, if you miss three or more appointments without notifying the office, you may be dismissed from the clinic at the provider?s discretion.    ?  ?For prescription refill requests, have your pharmacy contact our office and allow 72 hours for refills to be completed.   ? ?Today you received the following chemotherapy and/or immunotherapy agents: oxaliplatin    ?  ?To help prevent nausea and vomiting after your treatment, we encourage you to take your nausea medication as directed. ? ?BELOW ARE SYMPTOMS THAT SHOULD BE REPORTED IMMEDIATELY: ?*FEVER GREATER THAN 100.4 F (38 ?C) OR HIGHER ?*CHILLS OR SWEATING ?*NAUSEA AND VOMITING THAT IS NOT CONTROLLED WITH YOUR NAUSEA MEDICATION ?*UNUSUAL SHORTNESS OF BREATH ?*UNUSUAL BRUISING OR BLEEDING ?*URINARY PROBLEMS (pain or burning when urinating, or frequent urination) ?*BOWEL PROBLEMS (unusual diarrhea, constipation, pain near the anus) ?TENDERNESS IN MOUTH AND THROAT WITH OR WITHOUT PRESENCE OF ULCERS (sore throat, sores in mouth, or a toothache) ?UNUSUAL RASH, SWELLING OR PAIN  ?UNUSUAL VAGINAL DISCHARGE OR ITCHING  ? ?Items with * indicate a potential emergency and should be followed up as soon as possible or go to the Emergency Department if any problems should occur. ? ?Please show the CHEMOTHERAPY ALERT CARD or IMMUNOTHERAPY ALERT CARD at check-in  to the Emergency Department and triage nurse. ? ?Should you have questions after your visit or need to cancel or reschedule your appointment, please contact [facility] MEDICAL ONCOL[facility]  Dept: [phone]  and follow the prompts.  Office hours are 8:00 a.m. to 4:30 p.m. Monday - Friday. Please note that voicemails left after 4:00 p.m. may not be returned until the following business day.  We are closed weekends and major holidays. You have access to a nurse at all times for urgent questions. Please call the main number to the clinic Dept: [phone] and follow the prompts. ? ? ?For any non-urgent questions, you may also contact your provider using MyChart. We now offer e-Visits for anyone 18 and older to request care online for non-urgent symptoms. For details visit [email or web address]. ?  ?Also download the MyChart app! Go to the app store, search "MyChart", open the app, select [place], and log in with your MyChart username and password. ? ?Due to Covid, a mask is required upon entering the hospital/clinic. If you do not have a mask, one will be given to you upon arrival. For doctor visits, patients may have 1 support person aged 18 or older with them. For treatment visits, patients cannot have anyone with them due to current Covid guidelines and our immunocompromised population.  ? ?

## 2021-01-07 ENCOUNTER — Inpatient Hospital Stay: Payer: BC Managed Care – PPO

## 2021-01-07 ENCOUNTER — Other Ambulatory Visit: Payer: Self-pay

## 2021-01-07 DIAGNOSIS — C19 Malignant neoplasm of rectosigmoid junction: Secondary | ICD-10-CM | POA: Diagnosis not present

## 2021-01-07 LAB — CBC WITH DIFFERENTIAL/PLATELET
Abs Immature Granulocytes: 0.01 10*3/uL (ref 0.00–0.07)
Basophils Absolute: 0 10*3/uL (ref 0.0–0.1)
Basophils Relative: 0 %
Eosinophils Absolute: 0 10*3/uL (ref 0.0–0.5)
Eosinophils Relative: 1 %
HCT: 38.9 % — ABNORMAL LOW (ref 39.0–52.0)
Hemoglobin: 12.2 g/dL — ABNORMAL LOW (ref 13.0–17.0)
Immature Granulocytes: 0 %
Lymphocytes Relative: 41 %
Lymphs Abs: 1.6 10*3/uL (ref 0.7–4.0)
MCH: 20.7 pg — ABNORMAL LOW (ref 26.0–34.0)
MCHC: 31.4 g/dL (ref 30.0–36.0)
MCV: 66.2 fL — ABNORMAL LOW (ref 80.0–100.0)
Monocytes Absolute: 0.5 10*3/uL (ref 0.1–1.0)
Monocytes Relative: 12 %
Neutro Abs: 1.8 10*3/uL (ref 1.7–7.7)
Neutrophils Relative %: 46 %
Platelets: 207 10*3/uL (ref 150–400)
RBC: 5.88 MIL/uL — ABNORMAL HIGH (ref 4.22–5.81)
RDW: 18 % — ABNORMAL HIGH (ref 11.5–15.5)
WBC: 4 10*3/uL (ref 4.0–10.5)
nRBC: 0 % (ref 0.0–0.2)

## 2021-01-07 LAB — COMPREHENSIVE METABOLIC PANEL
ALT: 54 U/L — ABNORMAL HIGH (ref 0–44)
AST: 35 U/L (ref 15–41)
Albumin: 4.4 g/dL (ref 3.5–5.0)
Alkaline Phosphatase: 62 U/L (ref 38–126)
Anion gap: 7 (ref 5–15)
BUN: 11 mg/dL (ref 6–20)
CO2: 27 mmol/L (ref 22–32)
Calcium: 9.5 mg/dL (ref 8.9–10.3)
Chloride: 106 mmol/L (ref 98–111)
Creatinine, Ser: 0.88 mg/dL (ref 0.61–1.24)
GFR, Estimated: >60 mL/min (ref >60)
Glucose, Bld: 123 mg/dL — ABNORMAL HIGH (ref 70–99)
Potassium: 4.4 mmol/L (ref 3.5–5.1)
Sodium: 140 mmol/L (ref 135–145)
Total Bilirubin: 0.6 mg/dL (ref 0.3–1.2)
Total Protein: 7.2 g/dL (ref 6.5–8.1)

## 2021-01-14 ENCOUNTER — Other Ambulatory Visit: Payer: Self-pay | Admitting: Hematology

## 2021-01-14 DIAGNOSIS — C19 Malignant neoplasm of rectosigmoid junction: Secondary | ICD-10-CM

## 2021-01-22 MED FILL — Dexamethasone Sodium Phosphate Inj 100 MG/10ML: INTRAMUSCULAR | Qty: 1 | Status: AC

## 2021-01-23 ENCOUNTER — Other Ambulatory Visit: Payer: Self-pay

## 2021-01-23 ENCOUNTER — Inpatient Hospital Stay (HOSPITAL_BASED_OUTPATIENT_CLINIC_OR_DEPARTMENT_OTHER): Payer: BC Managed Care – PPO | Admitting: Hematology

## 2021-01-23 ENCOUNTER — Inpatient Hospital Stay: Payer: BC Managed Care – PPO

## 2021-01-23 ENCOUNTER — Encounter: Payer: Self-pay | Admitting: Hematology

## 2021-01-23 ENCOUNTER — Inpatient Hospital Stay: Payer: BC Managed Care – PPO | Attending: Hematology

## 2021-01-23 VITALS — BP 143/92 | HR 86 | Temp 99.0°F | Resp 18 | Ht 66.0 in | Wt 195.4 lb

## 2021-01-23 DIAGNOSIS — C19 Malignant neoplasm of rectosigmoid junction: Secondary | ICD-10-CM | POA: Diagnosis not present

## 2021-01-23 DIAGNOSIS — Z79899 Other long term (current) drug therapy: Secondary | ICD-10-CM | POA: Diagnosis not present

## 2021-01-23 DIAGNOSIS — Z7984 Long term (current) use of oral hypoglycemic drugs: Secondary | ICD-10-CM | POA: Insufficient documentation

## 2021-01-23 DIAGNOSIS — Z5111 Encounter for antineoplastic chemotherapy: Secondary | ICD-10-CM | POA: Diagnosis present

## 2021-01-23 DIAGNOSIS — E119 Type 2 diabetes mellitus without complications: Secondary | ICD-10-CM | POA: Insufficient documentation

## 2021-01-23 LAB — CBC WITH DIFFERENTIAL/PLATELET
Abs Immature Granulocytes: 0.01 10*3/uL (ref 0.00–0.07)
Basophils Absolute: 0 10*3/uL (ref 0.0–0.1)
Basophils Relative: 0 %
Eosinophils Absolute: 0 10*3/uL (ref 0.0–0.5)
Eosinophils Relative: 0 %
HCT: 39 % (ref 39.0–52.0)
Hemoglobin: 12.5 g/dL — ABNORMAL LOW (ref 13.0–17.0)
Immature Granulocytes: 0 %
Lymphocytes Relative: 31 %
Lymphs Abs: 1.2 10*3/uL (ref 0.7–4.0)
MCH: 21.2 pg — ABNORMAL LOW (ref 26.0–34.0)
MCHC: 32.1 g/dL (ref 30.0–36.0)
MCV: 66.1 fL — ABNORMAL LOW (ref 80.0–100.0)
Monocytes Absolute: 0.5 10*3/uL (ref 0.1–1.0)
Monocytes Relative: 12 %
Neutro Abs: 2.2 10*3/uL (ref 1.7–7.7)
Neutrophils Relative %: 57 %
Platelets: 199 10*3/uL (ref 150–400)
RBC: 5.9 MIL/uL — ABNORMAL HIGH (ref 4.22–5.81)
RDW: 20.1 % — ABNORMAL HIGH (ref 11.5–15.5)
WBC: 3.9 10*3/uL — ABNORMAL LOW (ref 4.0–10.5)
nRBC: 0 % (ref 0.0–0.2)

## 2021-01-23 LAB — COMPREHENSIVE METABOLIC PANEL
ALT: 95 U/L — ABNORMAL HIGH (ref 0–44)
AST: 62 U/L — ABNORMAL HIGH (ref 15–41)
Albumin: 4.4 g/dL (ref 3.5–5.0)
Alkaline Phosphatase: 65 U/L (ref 38–126)
Anion gap: 7 (ref 5–15)
BUN: 9 mg/dL (ref 6–20)
CO2: 26 mmol/L (ref 22–32)
Calcium: 9.5 mg/dL (ref 8.9–10.3)
Chloride: 106 mmol/L (ref 98–111)
Creatinine, Ser: 0.85 mg/dL (ref 0.61–1.24)
GFR, Estimated: >60 mL/min (ref >60)
Glucose, Bld: 159 mg/dL — ABNORMAL HIGH (ref 70–99)
Potassium: 3.7 mmol/L (ref 3.5–5.1)
Sodium: 139 mmol/L (ref 135–145)
Total Bilirubin: 0.5 mg/dL (ref 0.3–1.2)
Total Protein: 7.3 g/dL (ref 6.5–8.1)

## 2021-01-23 LAB — FERRITIN: Ferritin: 173 ng/mL (ref 24–336)

## 2021-01-23 MED ORDER — PALONOSETRON HCL INJECTION 0.25 MG/5ML
0.2500 mg | Freq: Once | INTRAVENOUS | Status: AC
  Administered 2021-01-23: 0.25 mg via INTRAVENOUS
  Filled 2021-01-23: qty 5

## 2021-01-23 MED ORDER — DEXTROSE 5 % IV SOLN: Freq: Once | INTRAVENOUS | Status: AC

## 2021-01-23 MED ORDER — SODIUM CHLORIDE 0.9 % IV SOLN
10.0000 mg | Freq: Once | INTRAVENOUS | Status: AC
  Administered 2021-01-23: 10 mg via INTRAVENOUS
  Filled 2021-01-23: qty 10

## 2021-01-23 MED ORDER — OXALIPLATIN CHEMO INJECTION 100 MG/20ML
130.0000 mg/m2 | Freq: Once | INTRAVENOUS | Status: AC
  Administered 2021-01-23: 265 mg via INTRAVENOUS
  Filled 2021-01-23: qty 53

## 2021-01-23 NOTE — Patient Instructions (Signed)
[facility] MEDICAL ONCOLOGY  Discharge Instructions: Thank you for choosing [facility] to provide your oncology and hematology care.   If you have a lab appointment with the Cancer Center, please go directly to the Cancer Center and check in at the registration area.   Wear comfortable clothing and clothing appropriate for easy access to any Portacath or PICC line.   We strive to give you quality time with your provider. You may need to reschedule your appointment if you arrive late (15 or more minutes).  Arriving late affects you and other patients whose appointments are after yours.  Also, if you miss three or more appointments without notifying the office, you may be dismissed from the clinic at the provider's discretion.      For prescription refill requests, have your pharmacy contact our office and allow 72 hours for refills to be completed.    Today you received the following chemotherapy and/or immunotherapy agents: Oxaliplatin.       To help prevent nausea and vomiting after your treatment, we encourage you to take your nausea medication as directed.  BELOW ARE SYMPTOMS THAT SHOULD BE REPORTED IMMEDIATELY: *FEVER GREATER THAN 100.4 F (38 C) OR HIGHER *CHILLS OR SWEATING *NAUSEA AND VOMITING THAT IS NOT CONTROLLED WITH YOUR NAUSEA MEDICATION *UNUSUAL SHORTNESS OF BREATH *UNUSUAL BRUISING OR BLEEDING *URINARY PROBLEMS (pain or burning when urinating, or frequent urination) *BOWEL PROBLEMS (unusual diarrhea, constipation, pain near the anus) TENDERNESS IN MOUTH AND THROAT WITH OR WITHOUT PRESENCE OF ULCERS (sore throat, sores in mouth, or a toothache) UNUSUAL RASH, SWELLING OR PAIN  UNUSUAL VAGINAL DISCHARGE OR ITCHING   Items with * indicate a potential emergency and should be followed up as soon as possible or go to the Emergency Department if any problems should occur.  Please show the CHEMOTHERAPY ALERT CARD or IMMUNOTHERAPY ALERT CARD at check-in  to the Emergency Department and triage nurse.  Should you have questions after your visit or need to cancel or reschedule your appointment, please contact [facility] ONCOL[facility]  Dept: [phone]  and follow the prompts.  Office hours are 8:00 a.m. to 4:30 p.m. Monday - Friday. Please note that voicemails left after 4:00 p.m. may not be returned until the following business day.  We are closed weekends and major holidays. You have access to a nurse at all times for urgent questions. Please call the main number to the clinic Dept: [phone] and follow the prompts.   For any non-urgent questions, you may also contact your provider using MyChart. We now offer e-Visits for anyone 18 and older to request care online for non-urgent symptoms. For details visit [email or web address].   Also download the MyChart app! Go to the app store, search "MyChart", open the app, select [place], and log in with your MyChart username and password.  Due to Covid, a mask is required upon entering the hospital/clinic. If you do not have a mask, one will be given to you upon arrival. For doctor visits, patients may have 1 support person aged 18 or older with them. For treatment visits, patients cannot have anyone with them due to current Covid guidelines and our immunocompromised population.   

## 2021-01-23 NOTE — Progress Notes (Signed)
[place]   Telephone:(336) [phone] Fax:(336) [phone]   Clinic Follow up Note   Patient Care Team: Elwyn Reach, MD as PCP - General (Internal Medicine) Michael Boston, MD as Consulting Physician (Colon and Rectal Surgery) Carol Ada, MD as Consulting Physician (Gastroenterology) Truitt Merle, MD as Consulting Physician (Oncology)  Date of Service:  01/23/2021  CHIEF COMPLAINT: f/u of colon cancer  CURRENT THERAPY:  Adjuvant CAPOX q3 weeks (oxaliplatin on day 1, xeloda 1500 mg AM/2000 mg PM days 1-14, q21 days), started 11/21/20, plan for 3 months   ASSESSMENT & PLAN:  Clifford [name] is a 46 y.o. male with   1. Cancer of rectosigmoid colon, pT2N1bM0, stage IIIA, MMR intact  -initially presented with hematochezia. Colonoscopy 09/25/20 with Dr. Benson Norway showed 3 cm sigmoid colon mass; path confirmed adenocarcinoma.  -CT CAP same day showed a slightly enlarged right common iliac lymph node 52m, no other evidence of distant metastasis. -resection on 10/24/20 under Dr. GJohney Maine with path showing 3.2 cm invasive adenocarcinoma involving rectosigmoid colon invading into muscularis propria. Two lymph nodes were positive (2/28). Margins were negative. -he started CAPOX, to be given q3weeks for 3 months, on 11/21/20. He tolerated well with mild cold sensitivity. No other AEs from xeloda so far  -labs reviewed, overall stable. He will receive his 4th and final cycle.   2. Genetics -given his young age, genetic testing was recommended. He underwent testing on 11/18/20. Results were negative.     PLAN:  -proceed with C4 CAPOX today, this is last cycle chemo  -f/u in 2 months with lab and CT CAP a few days before   No problem-specific Assessment & Plan notes found for this encounter.   SUMMARY OF ONCOLOGIC HISTORY: Oncology History  Can[place] of [place]osigmoid (colon) [place]  09/25/2020 Procedure   Colonoscopy, Dr. HBenson Norway Findings: -An infiltrative and ulcerated non-obstructing  large mass was found in the sigmoid colon. The mass was non-circumferential. The mass measured 3 cm in length. Oozing was present. This was biopsied with a cold snare for histology. Areas was tattooed.  -Approximately 17 cm from the anal verge a large mass was identified. There was a central ulceration and large rolled edges. The lesion was nonobstructing and it was less than 50% of the circumference. Multiple cold snare biopsies were obtained. The lesion was tattooed proximally and distally.   09/25/2020 Imaging   CT CAP  IMPRESSION: No definite abnormality is noted in the chest.  7 mm right common iliac lymph node is noted concerning for metastatic disease given the history of newly diagnosed colonic malignancy.  No other abnormality seen in the abdomen or pelvis.   09/25/2020 Pathology Results   FINAL DIAGNOSIS:  A. Colon, sigmoid, mass, biopsy  - INVASIVE COLONIC ADENOCARCINOMA, MODERATELY-DIFFERENTIATED.  Addendum: A. Colon, sigmoid, mass, biopsy  - IMMUNOHISTOCHEMICAL STAINS FOR MLH1, MSH2, MSH6, AND PMS2 ARE INTACT (NORMAL)   10/24/2020 Initial Diagnosis   Can[place] of rectosigmoid (colon) ([place]   10/24/2020 Cancer Staging   Staging form: Colon and Rectum, AJCC 8th Edition - Pathologic stage from 10/24/2020: Stage IIIA (pT2, pN1b, cM0) - Signed by FTruitt Merle MD on 11/14/2020 Histologic grading system: 4 grade system Histologic grade (G): G2 Residual tumor (R): R0 - None    10/24/2020 Definitive Surgery   -XI ROBOTIC LOW ANTERIOR RESECTION , OMENTAL PEDICLE FLAP WITH OMENTOPEXY, PERFUSION ASSESSMENT USING FIREFLY, TAP BLOCK (Abdomen)  -RIGID PROCTOSCOPY (Rectum)   10/24/2020 Pathology Results   FINAL MICROSCOPIC DIAGNOSIS:   A. RECTOSIGMOID  COLON, RESECTION:  - Invasive moderately differentiated adenocarcinoma, 3.2 cm, involving rectosigmoid colon  - Carcinoma invades into muscularis propria  - Metastatic carcinoma to two of twenty-eight lymph nodes (2/28)  - Resection margins  are negative for carcinoma  - See oncology table   B. FINAL DISTAL MARGIN, EXCISION:  - Colonic donut, negative for carcinoma   C. PERIAORTIC LYMPH NODE, EXCISION:  - Benign fibroadipose tissue  - Lymphoid tissue is not identified     Genetic Testing   [employer] Common Cancer Panel is Negative. Report date was 12/16/2020.  The Common Hereditary Cancers + RNA Panel offered by [employer] includes sequencing, deletion/duplication, and RNA testing of the following 47 genes: APC, ATM, AXIN2, BARD1, BMPR1A, BRCA1, BRCA2, BRIP1, CDH1, CDK4*, CDKN2A (p14ARF)*, CDKN2A (p16INK4a)*, CHEK2, CTNNA1, DICER1, EPCAM (Deletion/duplication testing only), GREM1 (promoter region deletion/duplication testing only), KIT, MEN1, MLH1, MSH2, MSH3, MSH6, MUTYH, NBN, NF1, NHTL1, PALB2, PDGFRA*, PMS2, POLD1, POLE, PTEN, RAD50, RAD51C, RAD51D, SDHB, SDHC, SDHD, SMAD4, SMARCA4. STK11, TP53, TSC1, TSC2, and VHL.  The following genes were evaluated for sequence changes only: SDHA and HOXB13 c.251G>A variant only.  RNA analysis is not performed for the * genes.     Rectosigmoid cancer ([place])  11/14/2020 Initial Diagnosis   Rectosigmoid cancer ([place])   11/21/2020 -  Chemotherapy   Patient is on Treatment Plan : COLORECTAL Xelox (Capeox) q21d        INTERVAL HISTORY:  Clifford [name] is here for a follow up of colon cancer. He was last seen by me on 01/02/21. He presents to the clinic alone. He reports some skin darkening but is tolerating treatment very well. He notes some cold sensitivity that resolved relatively quickly.   All other systems were reviewed with the patient and are negative.  MEDICAL HISTORY:  Past Medical History:  Diagnosis Date   Colon cancer ([place])    Diabetes mellitus without complication ([place])    Pre-diabetes     SURGICAL HISTORY: Past Surgical History:  Procedure Laterality Date   COLON SURGERY  10/24/20   HERNIA REPAIR     PROCTOSCOPY N/A 10/24/2020   Procedure: RIGID PROCTOSCOPY;   Surgeon: Michael Boston, MD;  Location: [facility];  Service: General;  Laterality: N/A;    I have reviewed the social history and family history with the patient and they are unchanged from previous note.  ALLERGIES:  has No Known Allergies.  MEDICATIONS:  Current Outpatient Medications  Medication Sig Dispense Refill   capecitabine (XELODA) 500 MG tablet TAKE 3 TABLETS BY MOUTH IN THE MORNING AND 4 TABLETS IN THE EVENING (12 HOURS APART AFTER MEALS) FOR 14 DAYS ON, THEN 7 DAYS OFF, AND THEN REPEAT. 98 tablet 0   metFORMIN (GLUCOPHAGE) 500 MG tablet Take 500 mg by mouth daily.     ondansetron (ZOFRAN) 8 MG tablet Take 1 tablet (8 mg total) by mouth 2 (two) times daily as needed for refractory nausea/vomiting. Start on day 3 after chemotherapy. 30 tablet 1   prochlorperazine (COMPAZINE) 10 MG tablet Take 1 tablet (10 mg total) by mouth every 6 (six) hours as needed (Nausea or vomiting). 30 tablet 1   traMADol (ULTRAM) 50 MG tablet Take 1-2 tablets (50-100 mg total) by mouth every 6 (six) hours as needed for moderate pain or severe pain. 20 tablet 0   No current facility-administered medications for this visit.   Facility-Administered Medications Ordered in Other Visits  Medication Dose Route Frequency Provider Last Rate Last Admin   oxaliplatin (ELOXATIN) 265 mg in  dextrose 5 % 500 mL chemo infusion  130 mg/m2 (Treatment Plan Recorded) Intravenous Once Truitt Merle, MD 277 mL/hr at 01/23/21 1355 265 mg at 01/23/21 1355    PHYSICAL EXAMINATION: ECOG PERFORMANCE STATUS: 1 - Symptomatic but completely ambulatory  Vitals:   01/23/21 1134  BP: (!) 143/92  Pulse: 86  Resp: 18  Temp: 99 F (37.2 C)  SpO2: 95%   Wt Readings from Last 3 Encounters:  01/23/21 195 lb 6.4 oz (88.6 kg)  01/02/21 197 lb 12.8 oz (89.7 kg)  12/12/20 198 lb (89.8 kg)     GENERAL:alert, no distress and comfortable SKIN: skin color normal, no rashes or significant lesions EYES: normal, Conjunctiva are pink and  non-injected, sclera clear  NEURO: alert & oriented x 3 with fluent speech  LABORATORY DATA:  I have reviewed the data as listed CBC Latest Ref Rng & Units 01/23/2021 01/07/2021 01/02/2021  WBC 4.0 - 10.5 K/uL 3.9(L) 4.0 4.3  Hemoglobin 13.0 - 17.0 g/dL 12.5(L) 12.2(L) 12.5(L)  Hematocrit 39.0 - 52.0 % 39.0 38.9(L) 39.7  Platelets 150 - 400 K/uL 199 207 220     CMP Latest Ref Rng & Units 01/23/2021 01/07/2021 01/02/2021  Glucose 70 - 99 mg/dL 159(H) 123(H) 150(H)  BUN 6 - 20 mg/dL 9 11 9   Creatinine 0.61 - 1.24 mg/dL 0.85 0.88 0.91  Sodium 135 - 145 mmol/L 139 140 142  Potassium 3.5 - 5.1 mmol/L 3.7 4.4 4.0  Chloride 98 - 111 mmol/L 106 106 107  CO2 22 - 32 mmol/L 26 27 26   Calcium 8.9 - 10.3 mg/dL 9.5 9.5 9.3  Total Protein 6.5 - 8.1 g/dL 7.3 7.2 7.1  Total Bilirubin 0.3 - 1.2 mg/dL 0.5 0.6 0.4  Alkaline Phos 38 - 126 U/L 65 62 65  AST 15 - 41 U/L 62(H) 35 41  ALT 0 - 44 U/L 95(H) 54(H) 100(H)      RADIOGRAPHIC STUDIES: I have personally reviewed the radiological images as listed and agreed with the findings in the report. No results found.    Orders Placed This Encounter  Procedures   CT CHEST ABDOMEN PELVIS W CONTRAST    Standing Status:   Future    Standing Expiration Date:   01/23/2022    Order Specific Question:   Preferred imaging location?    Answer:   [facility]    Order Specific Question:   Is Oral Contrast requested for this exam?    Answer:   Yes, Per Radiology protocol   All questions were answered. The patient knows to call the clinic with any problems, questions or concerns. No barriers to learning was detected. The total time spent in the appointment was 30 minutes.     Truitt Merle, MD 01/23/2021   I, Wilburn Mylar, am acting as scribe for Truitt Merle, MD.   I have reviewed the above documentation for accuracy and completeness, and I agree with the above.

## 2021-01-26 ENCOUNTER — Telehealth: Payer: Self-pay | Admitting: Hematology

## 2021-01-26 NOTE — Telephone Encounter (Signed)
Scheduled follow-up appointments per 1/6 los. Patient is aware.

## 2021-03-03 ENCOUNTER — Other Ambulatory Visit: Payer: Self-pay

## 2021-03-19 ENCOUNTER — Ambulatory Visit (HOSPITAL_COMMUNITY)
Admission: RE | Admit: 2021-03-19 | Discharge: 2021-03-19 | Disposition: A | Discharge status: Home / Self Care | Payer: BC Managed Care – PPO | Source: Ambulatory Visit | Attending: Hematology | Admitting: Hematology

## 2021-03-19 ENCOUNTER — Other Ambulatory Visit: Payer: Self-pay

## 2021-03-19 ENCOUNTER — Inpatient Hospital Stay: Payer: BC Managed Care – PPO | Attending: Hematology

## 2021-03-19 ENCOUNTER — Encounter (HOSPITAL_COMMUNITY): Payer: Self-pay

## 2021-03-19 DIAGNOSIS — C19 Malignant neoplasm of rectosigmoid junction: Secondary | ICD-10-CM | POA: Insufficient documentation

## 2021-03-19 DIAGNOSIS — Z9221 Personal history of antineoplastic chemotherapy: Secondary | ICD-10-CM | POA: Insufficient documentation

## 2021-03-19 DIAGNOSIS — Z7984 Long term (current) use of oral hypoglycemic drugs: Secondary | ICD-10-CM | POA: Insufficient documentation

## 2021-03-19 DIAGNOSIS — C772 Secondary and unspecified malignant neoplasm of intra-abdominal lymph nodes: Secondary | ICD-10-CM | POA: Insufficient documentation

## 2021-03-19 DIAGNOSIS — E119 Type 2 diabetes mellitus without complications: Secondary | ICD-10-CM | POA: Insufficient documentation

## 2021-03-19 LAB — COMPREHENSIVE METABOLIC PANEL
ALT: 26 U/L (ref 0–44)
AST: 24 U/L (ref 15–41)
Albumin: 4.5 g/dL (ref 3.5–5.0)
Alkaline Phosphatase: 65 U/L (ref 38–126)
Anion gap: 7 (ref 5–15)
BUN: 10 mg/dL (ref 6–20)
CO2: 29 mmol/L (ref 22–32)
Calcium: 9.7 mg/dL (ref 8.9–10.3)
Chloride: 105 mmol/L (ref 98–111)
Creatinine, Ser: 0.89 mg/dL (ref 0.61–1.24)
GFR, Estimated: >60 mL/min (ref >60)
Glucose, Bld: 112 mg/dL — ABNORMAL HIGH (ref 70–99)
Potassium: 4.3 mmol/L (ref 3.5–5.1)
Sodium: 141 mmol/L (ref 135–145)
Total Bilirubin: 0.4 mg/dL (ref 0.3–1.2)
Total Protein: 7.4 g/dL (ref 6.5–8.1)

## 2021-03-19 LAB — CBC WITH DIFFERENTIAL/PLATELET
Abs Immature Granulocytes: 0.01 10*3/uL (ref 0.00–0.07)
Basophils Absolute: 0 10*3/uL (ref 0.0–0.1)
Basophils Relative: 0 %
Eosinophils Absolute: 0 10*3/uL (ref 0.0–0.5)
Eosinophils Relative: 1 %
HCT: 40.7 % (ref 39.0–52.0)
Hemoglobin: 12.8 g/dL — ABNORMAL LOW (ref 13.0–17.0)
Immature Granulocytes: 0 %
Lymphocytes Relative: 36 %
Lymphs Abs: 1.6 10*3/uL (ref 0.7–4.0)
MCH: 21.7 pg — ABNORMAL LOW (ref 26.0–34.0)
MCHC: 31.4 g/dL (ref 30.0–36.0)
MCV: 68.9 fL — ABNORMAL LOW (ref 80.0–100.0)
Monocytes Absolute: 0.4 10*3/uL (ref 0.1–1.0)
Monocytes Relative: 8 %
Neutro Abs: 2.4 10*3/uL (ref 1.7–7.7)
Neutrophils Relative %: 55 %
Platelets: 226 10*3/uL (ref 150–400)
RBC: 5.91 MIL/uL — ABNORMAL HIGH (ref 4.22–5.81)
RDW: 16.7 % — ABNORMAL HIGH (ref 11.5–15.5)
WBC: 4.4 10*3/uL (ref 4.0–10.5)
nRBC: 0 % (ref 0.0–0.2)

## 2021-03-19 LAB — FERRITIN: Ferritin: 50 ng/mL (ref 24–336)

## 2021-03-19 LAB — CEA (IN HOUSE-CHCC): CEA (CHCC-In House): 1.49 ng/mL (ref 0.00–5.00)

## 2021-03-19 MED ORDER — SODIUM CHLORIDE (PF) 0.9 % IJ SOLN
INTRAMUSCULAR | Status: AC
  Filled 2021-03-19: qty 50

## 2021-03-19 MED ORDER — IOHEXOL 300 MG/ML  SOLN
100.0000 mL | Freq: Once | INTRAMUSCULAR | Status: AC | PRN
  Administered 2021-03-19: 100 mL via INTRAVENOUS

## 2021-03-23 ENCOUNTER — Inpatient Hospital Stay (HOSPITAL_BASED_OUTPATIENT_CLINIC_OR_DEPARTMENT_OTHER): Payer: BC Managed Care – PPO | Admitting: Hematology

## 2021-03-23 ENCOUNTER — Encounter: Payer: Self-pay | Admitting: Hematology

## 2021-03-23 ENCOUNTER — Other Ambulatory Visit: Payer: Self-pay

## 2021-03-23 VITALS — BP 135/83 | HR 61 | Temp 98.7°F | Resp 17 | Ht 66.0 in | Wt 197.5 lb

## 2021-03-23 DIAGNOSIS — Z9221 Personal history of antineoplastic chemotherapy: Secondary | ICD-10-CM | POA: Diagnosis not present

## 2021-03-23 DIAGNOSIS — C19 Malignant neoplasm of rectosigmoid junction: Secondary | ICD-10-CM

## 2021-03-23 DIAGNOSIS — E119 Type 2 diabetes mellitus without complications: Secondary | ICD-10-CM | POA: Diagnosis not present

## 2021-03-23 DIAGNOSIS — Z7984 Long term (current) use of oral hypoglycemic drugs: Secondary | ICD-10-CM | POA: Diagnosis not present

## 2021-03-23 DIAGNOSIS — C772 Secondary and unspecified malignant neoplasm of intra-abdominal lymph nodes: Secondary | ICD-10-CM | POA: Diagnosis not present

## 2021-03-23 NOTE — Progress Notes (Signed)
[place]   Telephone:(336) [phone] Fax:(336) [phone]   Clinic Follow up Note   Patient Care Team: Elwyn Reach, MD as PCP - General (Internal Medicine) Michael Boston, MD as Consulting Physician (Colon and Rectal Surgery) Carol Ada, MD as Consulting Physician (Gastroenterology) Truitt Merle, MD as Consulting Physician (Oncology)  Date of Service:  03/23/2021  CHIEF COMPLAINT: f/u of colon cancer  CURRENT THERAPY:  Surveillance  ASSESSMENT & PLAN:  Clifford [name] is a 46 y.o. male with   1. Cancer of rectosigmoid colon, pT2N1bM0, stage IIIA, MMR intact  -Diagnosed in 09/2020 -resection on 10/24/20 under Dr. Johney Maine, with path showing 3.2 cm invasive adenocarcinoma involving rectosigmoid colon invading into muscularis propria. Two lymph nodes were positive (2/28). Margins were negative. -he received 3 months of CAPOX, given q3weeks, 11/21/20-01/23/21. He tolerated well with mild cold sensitivity.  -post-treatment CT CAP on 03/19/21 showed: indeterminate 12 mm focus of hypo-attenuation in inferior tip of right liver; indeterminate, possibly benign, 9 mm nodule in midline omentum potentially related to surgery. I personally reviewed the scan images and discussed the findings with patient in detail today. I discussed option of abdominal MRI to further evaluate the live lesion, vs repeating CT abdomen and pelvis with contrast to evaluate both liver and omentum lesion in 3 months.  We decided to do a follow-up CT scan in 3 months, given the relatively low suspicion for liver metastasis on CT, patient agrees with the plan. -labs from 03/19/21 reviewed, improving off chemo. CEA remains WNL.  He is clinically doing well, no clinical concern for recurrence.   2. Genetics -given his young age, genetic testing was recommended. He underwent testing on 11/18/20. Results were negative.     PLAN:  -Lab and CT scan discussed with patient  -f/u in 3 months with lab and CT AP w contrast  a few days before   No problem-specific Assessment & Plan notes found for this encounter.   SUMMARY OF ONCOLOGIC HISTORY: Oncology History  Cancer of rectosigmoid (colon) ([place])  09/25/2020 Procedure   Colonoscopy, Dr. Benson Norway  Findings: -An infiltrative and ulcerated non-obstructing large mass was found in the sigmoid colon. The mass was non-circumferential. The mass measured 3 cm in length. Oozing was present. This was biopsied with a cold snare for histology. Areas was tattooed.  -Approximately 17 cm from the anal verge a large mass was identified. There was a central ulceration and large rolled edges. The lesion was nonobstructing and it was less than 50% of the circumference. Multiple cold snare biopsies were obtained. The lesion was tattooed proximally and distally.   09/25/2020 Imaging   CT CAP  IMPRESSION: No definite abnormality is noted in the chest.  7 mm right common iliac lymph node is noted concerning for metastatic disease given the history of newly diagnosed colonic malignancy.  No other abnormality seen in the abdomen or pelvis.   09/25/2020 Pathology Results   FINAL DIAGNOSIS:  A. Colon, sigmoid, mass, biopsy  - INVASIVE COLONIC ADENOCARCINOMA, MODERATELY-DIFFERENTIATED.  Addendum: A. Colon, sigmoid, mass, biopsy  - IMMUNOHISTOCHEMICAL STAINS FOR MLH1, MSH2, MSH6, AND PMS2 ARE INTACT (NORMAL)   10/24/2020 Initial Diagnosis   Cancer of rectosigmoid (colon) ([place])   10/24/2020 Cancer Staging   Staging form: Colon and Rectum, AJCC 8th Edition - Pathologic stage from 10/24/2020: Stage IIIA (pT2, pN1b, cM0) - Signed by Truitt Merle, MD on 11/14/2020 Histologic grading system: 4 grade system Histologic grade (G): G2 Residual tumor (R): R0 - None  10/24/2020 Definitive Surgery   -XI ROBOTIC LOW ANTERIOR RESECTION , OMENTAL PEDICLE FLAP WITH OMENTOPEXY, PERFUSION ASSESSMENT USING FIREFLY, TAP BLOCK (Abdomen)  -RIGID PROCTOSCOPY (Rectum)   10/24/2020 Pathology Results    FINAL MICROSCOPIC DIAGNOSIS:   A. RECTOSIGMOID COLON, RESECTION:  - Invasive moderately differentiated adenocarcinoma, 3.2 cm, involving rectosigmoid colon  - Carcinoma invades into muscularis propria  - Metastatic carcinoma to two of twenty-eight lymph nodes (2/28)  - Resection margins are negative for carcinoma  - See oncology table   B. FINAL DISTAL MARGIN, EXCISION:  - Colonic donut, negative for carcinoma   C. PERIAORTIC LYMPH NODE, EXCISION:  - Benign fibroadipose tissue  - Lymphoid tissue is not identified    11/18/2020 Genetic Testing   [employer] Common Cancer Panel is Negative. Report date was 12/16/2020.  The Common Hereditary Cancers + RNA Panel offered by [employer] includes sequencing, deletion/duplication, and RNA testing of the following 47 genes: APC, ATM, AXIN2, BARD1, BMPR1A, BRCA1, BRCA2, BRIP1, CDH1, CDK4*, CDKN2A (p14ARF)*, CDKN2A (p16INK4a)*, CHEK2, CTNNA1, DICER1, EPCAM (Deletion/duplication testing only), GREM1 (promoter region deletion/duplication testing only), KIT, MEN1, MLH1, MSH2, MSH3, MSH6, MUTYH, NBN, NF1, NHTL1, PALB2, PDGFRA*, PMS2, POLD1, POLE, PTEN, RAD50, RAD51C, RAD51D, SDHB, SDHC, SDHD, SMAD4, SMARCA4. STK11, TP53, TSC1, TSC2, and VHL.  The following genes were evaluated for sequence changes only: SDHA and HOXB13 c.251G>A variant only.  RNA analysis is not performed for the * genes.     03/19/2021 Imaging   EXAM: CT CHEST, ABDOMEN, AND PELVIS WITH CONTRAST  IMPRESSION: 1. 12 mm subtle focus of hypo attenuation in the inferior tip of the right liver. This is may be a focus of subcapsular fat deposition. MRI of the abdomen with and without contrast recommended to further evaluate. 2. 9 mm nodule in the midline omentum. Potentially related to surgery, continued close attention on follow-up recommended. 3. Large stool volume. Imaging features compatible with clinical constipation.   Rectosigmoid cancer ([place])  11/14/2020 Initial Diagnosis   Rectosigmoid  cancer ([place])   11/21/2020 -  Chemotherapy   Patient is on Treatment Plan : COLORECTAL Xelox (Capeox) q21d     03/19/2021 Imaging   EXAM: CT CHEST, ABDOMEN, AND PELVIS WITH CONTRAST  IMPRESSION: 1. 12 mm subtle focus of hypo attenuation in the inferior tip of the right liver. This is may be a focus of subcapsular fat deposition. MRI of the abdomen with and without contrast recommended to further evaluate. 2. 9 mm nodule in the midline omentum. Potentially related to surgery, continued close attention on follow-up recommended. 3. Large stool volume. Imaging features compatible with clinical constipation.      INTERVAL HISTORY:  Clifford [name] is here for a follow up of colon cancer. He was last seen by me on 01/23/21. He presents to the clinic alone. He reports he is recovering well since finishing chemo. He denies any stomach pain or issues.   All other systems were reviewed with the patient and are negative.  MEDICAL HISTORY:  Past Medical History:  Diagnosis Date   Colon cancer ([place])    Diabetes mellitus without complication ([place])    Pre-diabetes     SURGICAL HISTORY: Past Surgical History:  Procedure Laterality Date   COLON SURGERY  10/24/20   HERNIA REPAIR     PROCTOSCOPY N/A 10/24/2020   Procedure: RIGID PROCTOSCOPY;  Surgeon: Michael Boston, MD;  Location: [facility];  Service: General;  Laterality: N/A;    I have reviewed the social history and family history with the patient and they are  unchanged from previous note.  ALLERGIES:  has No Known Allergies.  MEDICATIONS:  Current Outpatient Medications  Medication Sig Dispense Refill   metFORMIN (GLUCOPHAGE) 500 MG tablet Take 500 mg by mouth daily.     No current facility-administered medications for this visit.    PHYSICAL EXAMINATION: ECOG PERFORMANCE STATUS: 0 - Asymptomatic  Vitals:   03/23/21 0953  BP: 135/83  Pulse: 61  Resp: 17  Temp: 98.7 F (37.1 C)  SpO2: 100%   Wt Readings from Last 3  Encounters:  03/23/21 197 lb 8 oz (89.6 kg)  01/23/21 195 lb 6.4 oz (88.6 kg)  01/02/21 197 lb 12.8 oz (89.7 kg)     GENERAL:alert, no distress and comfortable SKIN: skin color normal, no rashes or significant lesions EYES: normal, Conjunctiva are pink and non-injected, sclera clear  NEURO: alert & oriented x 3 with fluent speech  LABORATORY DATA:  I have reviewed the data as listed CBC Latest Ref Rng & Units 03/19/2021 01/23/2021 01/07/2021  WBC 4.0 - 10.5 K/uL 4.4 3.9(L) 4.0  Hemoglobin 13.0 - 17.0 g/dL 12.8(L) 12.5(L) 12.2(L)  Hematocrit 39.0 - 52.0 % 40.7 39.0 38.9(L)  Platelets 150 - 400 K/uL 226 199 207     CMP Latest Ref Rng & Units 03/19/2021 01/23/2021 01/07/2021  Glucose 70 - 99 mg/dL 112(H) 159(H) 123(H)  BUN 6 - 20 mg/dL _0 Creatinine 0.61 - 1.24 mg/dL 0.89 0.85 0.88  Sodium 135 - 145 mmol/L 141 139 140  Potassium 3.5 - 5.1 mmol/L 4.3 3.7 4.4  Chloride 98 - 111 mmol/L 105 106 106  CO2 22 - 32 mmol/L _1 Calcium 8.9 - 10.3 mg/dL 9.7 9.5 9.5  Total Protein 6.5 - 8.1 g/dL 7.4 7.3 7.2  Total Bilirubin 0.3 - 1.2 mg/dL 0.4 0.5 0.6  Alkaline Phos 38 - 126 U/L 65 65 62  AST 15 - 41 U/L 24 62(H) 35  ALT 0 - 44 U/L 26 95(H) 54(H)      RADIOGRAPHIC STUDIES: I have personally reviewed the radiological images as listed and agreed with the findings in the report. No results found.    Orders Placed This Encounter  Procedures   CT ABDOMEN PELVIS W CONTRAST    Standing Status:   Future    Standing Expiration Date:   03/24/2022    Order Specific Question:   If indicated for the ordered procedure, I authorize the administration of contrast media per Radiology protocol    Answer:   Yes    Order Specific Question:   Preferred imaging location?    Answer:   [facility]    Order Specific Question:   Release to patient    Answer:   Immediate    Order Specific Question:   Is Oral Contrast requested for this exam?    Answer:   Yes, Per Radiology protocol   All  questions were answered. The patient knows to call the clinic with any problems, questions or concerns. No barriers to learning was detected. The total time spent in the appointment was 30 minutes.     Truitt Merle, MD 03/23/2021   I, Wilburn Mylar, am acting as scribe for Truitt Merle, MD.   I have reviewed the above documentation for accuracy and completeness, and I agree with the above.

## 2021-06-19 ENCOUNTER — Inpatient Hospital Stay: Payer: BC Managed Care – PPO | Attending: Hematology

## 2021-06-19 ENCOUNTER — Other Ambulatory Visit: Payer: Self-pay

## 2021-06-19 ENCOUNTER — Ambulatory Visit (HOSPITAL_COMMUNITY)
Admission: RE | Admit: 2021-06-19 | Discharge: 2021-06-19 | Disposition: A | Discharge status: Home / Self Care | Payer: BC Managed Care – PPO | Source: Ambulatory Visit | Attending: Hematology | Admitting: Hematology

## 2021-06-19 DIAGNOSIS — C19 Malignant neoplasm of rectosigmoid junction: Secondary | ICD-10-CM

## 2021-06-19 LAB — COMPREHENSIVE METABOLIC PANEL
ALT: 34 U/L (ref 0–44)
AST: 22 U/L (ref 15–41)
Albumin: 4.6 g/dL (ref 3.5–5.0)
Alkaline Phosphatase: 72 U/L (ref 38–126)
Anion gap: 8 (ref 5–15)
BUN: 11 mg/dL (ref 6–20)
CO2: 27 mmol/L (ref 22–32)
Calcium: 10 mg/dL (ref 8.9–10.3)
Chloride: 104 mmol/L (ref 98–111)
Creatinine, Ser: 0.94 mg/dL (ref 0.61–1.24)
GFR, Estimated: >60 mL/min (ref >60)
Glucose, Bld: 124 mg/dL — ABNORMAL HIGH (ref 70–99)
Potassium: 4 mmol/L (ref 3.5–5.1)
Sodium: 139 mmol/L (ref 135–145)
Total Bilirubin: 0.4 mg/dL (ref 0.3–1.2)
Total Protein: 7.3 g/dL (ref 6.5–8.1)

## 2021-06-19 LAB — CBC WITH DIFFERENTIAL/PLATELET
Abs Immature Granulocytes: 0.01 10*3/uL (ref 0.00–0.07)
Basophils Absolute: 0 10*3/uL (ref 0.0–0.1)
Basophils Relative: 0 %
Eosinophils Absolute: 0 10*3/uL (ref 0.0–0.5)
Eosinophils Relative: 0 %
HCT: 41.2 % (ref 39.0–52.0)
Hemoglobin: 13.2 g/dL (ref 13.0–17.0)
Immature Granulocytes: 0 %
Lymphocytes Relative: 33 %
Lymphs Abs: 1.7 10*3/uL (ref 0.7–4.0)
MCH: 21.1 pg — ABNORMAL LOW (ref 26.0–34.0)
MCHC: 32 g/dL (ref 30.0–36.0)
MCV: 65.7 fL — ABNORMAL LOW (ref 80.0–100.0)
Monocytes Absolute: 0.4 10*3/uL (ref 0.1–1.0)
Monocytes Relative: 7 %
Neutro Abs: 3.1 10*3/uL (ref 1.7–7.7)
Neutrophils Relative %: 60 %
Platelets: 202 10*3/uL (ref 150–400)
RBC: 6.27 MIL/uL — ABNORMAL HIGH (ref 4.22–5.81)
RDW: 14.3 % (ref 11.5–15.5)
WBC: 5.2 10*3/uL (ref 4.0–10.5)
nRBC: 0 % (ref 0.0–0.2)

## 2021-06-19 LAB — FERRITIN: Ferritin: 36 ng/mL (ref 24–336)

## 2021-06-19 LAB — CEA (IN HOUSE-CHCC): CEA (CHCC-In House): <1 ng/mL (ref 0.00–5.00)

## 2021-06-19 MED ORDER — IOHEXOL 300 MG/ML  SOLN
100.0000 mL | Freq: Once | INTRAMUSCULAR | Status: AC | PRN
  Administered 2021-06-19: 100 mL via INTRAVENOUS

## 2021-06-19 MED ORDER — SODIUM CHLORIDE (PF) 0.9 % IJ SOLN
INTRAMUSCULAR | Status: AC
  Filled 2021-06-19: qty 50

## 2021-06-23 ENCOUNTER — Ambulatory Visit: Payer: BC Managed Care – PPO | Admitting: Nurse Practitioner

## 2021-06-23 NOTE — Progress Notes (Unsigned)
[place]   [phone] Fax:([phone]) [phone]   Clinic Follow up Note   Patient Care Team: Elwyn Reach, MD as PCP - General (Internal Medicine) Michael Boston, MD as Consulting Physician (Colon and Rectal Surgery) Carol Ada, MD as Consulting Physician (Gastroenterology) Truitt Merle, MD as Consulting Physician (Oncology) 06/24/2021  CHIEF COMPLAINT: Follow up colon cancer   SUMMARY OF ONCOLOGIC HISTORY: Oncology History  Cancer of rectosigmoid (colon) ([place])  09/25/2020 Procedure   Colonoscopy, Dr. Benson Norway  Findings: -An infiltrative and ulcerated non-obstructing large mass was found in the sigmoid colon. The mass was non-circumferential. The mass measured 3 cm in length. Oozing was present. This was biopsied with a cold snare for histology. Areas was tattooed.  -Approximately 17 cm from the anal verge a large mass was identified. There was a central ulceration and large rolled edges. The lesion was nonobstructing and it was less than 50% of the circumference. Multiple cold snare biopsies were obtained. The lesion was tattooed proximally and distally.   09/25/2020 Imaging   CT CAP  IMPRESSION: No definite abnormality is noted in the chest.  7 mm right common iliac lymph node is noted concerning for metastatic disease given the history of newly diagnosed colonic malignancy.  No other abnormality seen in the abdomen or pelvis.   09/25/2020 Pathology Results   FINAL DIAGNOSIS:  A. Colon, sigmoid, mass, biopsy  - INVASIVE COLONIC ADENOCARCINOMA, MODERATELY-DIFFERENTIATED.  Addendum: A. Colon, sigmoid, mass, biopsy  - IMMUNOHISTOCHEMICAL STAINS FOR MLH1, MSH2, MSH6, AND PMS2 ARE INTACT (NORMAL)   10/24/2020 Initial Diagnosis   Cancer of rectosigmoid (colon) ([place])    10/24/2020 Cancer Staging   Staging form: Colon and Rectum, AJCC 8th Edition - Pathologic stage from 10/24/2020: Stage IIIA (pT2, pN1b, cM0) - Signed by Truitt Merle, MD on  11/14/2020 Histologic grading system: 4 grade system Histologic grade (G): G2 Residual tumor (R): R0 - None    10/24/2020 Definitive Surgery   -XI ROBOTIC LOW ANTERIOR RESECTION , OMENTAL PEDICLE FLAP WITH OMENTOPEXY, PERFUSION ASSESSMENT USING FIREFLY, TAP BLOCK (Abdomen)  -RIGID PROCTOSCOPY (Rectum)   10/24/2020 Pathology Results   FINAL MICROSCOPIC DIAGNOSIS:   A. RECTOSIGMOID COLON, RESECTION:  - Invasive moderately differentiated adenocarcinoma, 3.2 cm, involving rectosigmoid colon  - Carcinoma invades into muscularis propria  - Metastatic carcinoma to two of twenty-eight lymph nodes (2/28)  - Resection margins are negative for carcinoma  - See oncology table   B. FINAL DISTAL MARGIN, EXCISION:  - Colonic donut, negative for carcinoma   C. PERIAORTIC LYMPH NODE, EXCISION:  - Benign fibroadipose tissue  - Lymphoid tissue is not identified    11/18/2020 Genetic Testing   [employer] Common Cancer Panel is Negative. Report date was 12/16/2020.  The Common Hereditary Cancers + RNA Panel offered by [employer] includes sequencing, deletion/duplication, and RNA testing of the following 47 genes: APC, ATM, AXIN2, BARD1, BMPR1A, BRCA1, BRCA2, BRIP1, CDH1, CDK4*, CDKN2A (p14ARF)*, CDKN2A (p16INK4a)*, CHEK2, CTNNA1, DICER1, EPCAM (Deletion/duplication testing only), GREM1 (promoter region deletion/duplication testing only), KIT, MEN1, MLH1, MSH2, MSH3, MSH6, MUTYH, NBN, NF1, NHTL1, PALB2, PDGFRA*, PMS2, POLD1, POLE, PTEN, RAD50, RAD51C, RAD51D, SDHB, SDHC, SDHD, SMAD4, SMARCA4. STK11, TP53, TSC1, TSC2, and VHL.  The following genes were evaluated for sequence changes only: SDHA and HOXB13 c.251G>A variant only.  RNA analysis is not performed for the * genes.     03/19/2021 Imaging   EXAM: CT CHEST, ABDOMEN, AND PELVIS WITH CONTRAST  IMPRESSION: 1. 12 mm subtle focus of hypo attenuation in the  inferior tip of the right liver. This is may be a focus of subcapsular fat deposition. MRI of the  abdomen with and without contrast recommended to further evaluate. 2. 9 mm nodule in the midline omentum. Potentially related to surgery, continued close attention on follow-up recommended. 3. Large stool volume. Imaging features compatible with clinical constipation.   06/19/2021 Imaging   CT AP w contrast (3 mo f/up to 03/2021 CT) IMPRESSION: 1. Status post partial sigmoidectomy without evidence of local recurrence. 2. No convincing evidence of metastatic disease in the abdomen or pelvis. 3. Decreased size of the small omental nodule, favored postsurgical, continued attention on follow-up imaging suggested. 4. Previously identified subtle hepatic lesion is not seen on today's examination and favored benign.   Rectosigmoid cancer ([place])  11/14/2020 Initial Diagnosis   Rectosigmoid cancer ([place])    11/21/2020 -  Chemotherapy   Patient is on Treatment Plan : COLORECTAL Xelox (Capeox) q21d      03/19/2021 Imaging   EXAM: CT CHEST, ABDOMEN, AND PELVIS WITH CONTRAST  IMPRESSION: 1. 12 mm subtle focus of hypo attenuation in the inferior tip of the right liver. This is may be a focus of subcapsular fat deposition. MRI of the abdomen with and without contrast recommended to further evaluate. 2. 9 mm nodule in the midline omentum. Potentially related to surgery, continued close attention on follow-up recommended. 3. Large stool volume. Imaging features compatible with clinical constipation.     CURRENT THERAPY: Surveillance   INTERVAL HISTORY: Mr. Shipes returns for surveillance visit as scheduled. Last seen by Dr. Burr Medico 03/23/21.  He feels well without changes in his health overall.  Energy and appetite are normal.  Denies unintentional weight loss, abdominal pain/bloating, nausea, change in bowel habits, rectal bleeding, or any other new specific complaints.  He had lab and a follow up CT on 06/19/21 and is here for results.   All other systems were reviewed with the patient and are  negative.  MEDICAL HISTORY:  Past Medical History:  Diagnosis Date   Colon cancer ([place])    Diabetes mellitus without complication ([place])    Pre-diabetes     SURGICAL HISTORY: Past Surgical History:  Procedure Laterality Date   COLON SURGERY  10/24/20   HERNIA REPAIR     PROCTOSCOPY N/A 10/24/2020   Procedure: RIGID PROCTOSCOPY;  Surgeon: Michael Boston, MD;  Location: [facility];  Service: General;  Laterality: N/A;    I have reviewed the social history and family history with the patient and they are unchanged from previous note.  ALLERGIES:  has No Known Allergies.  MEDICATIONS:  Current Outpatient Medications  Medication Sig Dispense Refill   metFORMIN (GLUCOPHAGE) 500 MG tablet Take 500 mg by mouth daily.     No current facility-administered medications for this visit.    PHYSICAL EXAMINATION: ECOG PERFORMANCE STATUS: 0 - Asymptomatic  Vitals:   06/24/21 0942  BP: 140/90  Pulse: 65  Resp: 16  Temp: 97.8 F (36.6 C)  SpO2: 100%   Filed Weights   06/24/21 0942  Weight: 194 lb 4.8 oz (88.1 kg)    GENERAL:alert, no distress and comfortable SKIN: No rash EYES: sclera clear LUNGS: clear,  normal breathing effort HEART: regular rate & rhythm, no lower extremity edema ABDOMEN:abdomen soft, non-tender and normal bowel sounds NEURO: alert & oriented x 3 with fluent speech, no focal motor/sensory deficits  LABORATORY DATA:  I have reviewed the data as listed    Latest Ref Rng & Units 06/19/2021  7:56 AM 03/19/2021    8:41 AM 01/23/2021   11:11 AM  CBC  WBC 4.0 - 10.5 K/uL 5.2   4.4   3.9    Hemoglobin 13.0 - 17.0 g/dL 13.2   12.8   12.5    Hematocrit 39.0 - 52.0 % 41.2   40.7   39.0    Platelets 150 - 400 K/uL 202   226   199          Latest Ref Rng & Units 06/19/2021    7:56 AM 03/19/2021    8:41 AM 01/23/2021   11:11 AM  CMP  Glucose 70 - 99 mg/dL 124   112   159    BUN 6 - 20 mg/dL 11   10   9     Creatinine 0.61 - 1.24 mg/dL 0.94   0.89   0.85    Sodium 135  - 145 mmol/L 139   141   139    Potassium 3.5 - 5.1 mmol/L 4.0   4.3   3.7    Chloride 98 - 111 mmol/L 104   105   106    CO2 22 - 32 mmol/L 27   29   26     Calcium 8.9 - 10.3 mg/dL 10.0   9.7   9.5    Total Protein 6.5 - 8.1 g/dL 7.3   7.4   7.3    Total Bilirubin 0.3 - 1.2 mg/dL 0.4   0.4   0.5    Alkaline Phos 38 - 126 U/L 72   65   65    AST 15 - 41 U/L 22   24   62    ALT 0 - 44 U/L 34   26   95        RADIOGRAPHIC STUDIES: I have personally reviewed the radiological images as listed and agreed with the findings in the report. No results found.   ASSESSMENT & PLAN: Clifford [name] is a 46 y.o. male with    1. Cancer of rectosigmoid colon, pT2N1bM0, stage IIIA, MMR intact  -initially presented with hematochezia. Colonoscopy 09/25/20 with Dr. Benson Norway showed 3 cm sigmoid colon mass; path confirmed adenocarcinoma. CT CAP same day showed a slightly enlarged right common iliac lymph node 64m, no other evidence of distant metastasis. -S/p resection on 10/24/20 by Dr. GJohney Maine with path showing 3.2 cm invasive adenocarcinoma involving rectosigmoid colon invading into muscularis propria. Two lymph nodes were positive (2/28). Margins were negative. -due to high recurrence risk in stage III colon cancer, he completed adjuvant chemotherapy with CapeOx adjuvant CAPOX q3weeks for 3 months from 11/21/20-01/23/2021.  He tolerated well with mild cold sensitivity  -Doing well on surveillance   2. 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  His oldest son is affected with sickle cell disease and underwent transplant, donor was his youngest child.  The middle child is unaffected   Disposition: Mr. AMedeirosis clinically doing well.  Exam is benign, labs are unremarkable, CEA is normal.   I reviewed his CT abdomen/pelvis which shows no evidence of recurrent or metastatic disease.  Continue surveillance.  He will be due his 1 year colonoscopy by Dr. HBenson Norwayin October.  Next surveillance visit in 3 months.   All questions were answered. The patient  knows to call the clinic with any problems, questions or concerns. No barriers to learning was detected. I spent 20 minutes counseling the patient face to face. The total time spent in the appointment was 30 minutes and more than 50% was on counseling and review of test results     Alla Feeling, NP 06/24/21

## 2021-06-24 ENCOUNTER — Other Ambulatory Visit: Payer: Self-pay

## 2021-06-24 ENCOUNTER — Encounter: Payer: Self-pay | Admitting: Nurse Practitioner

## 2021-06-24 ENCOUNTER — Inpatient Hospital Stay (HOSPITAL_BASED_OUTPATIENT_CLINIC_OR_DEPARTMENT_OTHER): Payer: BC Managed Care – PPO | Admitting: Nurse Practitioner

## 2021-06-24 VITALS — BP 140/90 | HR 65 | Temp 97.8°F | Resp 16 | Ht 66.0 in | Wt 194.3 lb

## 2021-06-24 DIAGNOSIS — Z85038 Personal history of other malignant neoplasm of large intestine: Secondary | ICD-10-CM | POA: Insufficient documentation

## 2021-06-24 DIAGNOSIS — C19 Malignant neoplasm of rectosigmoid junction: Secondary | ICD-10-CM | POA: Diagnosis not present

## 2021-06-25 ENCOUNTER — Telehealth: Payer: Self-pay | Admitting: Hematology

## 2021-06-25 NOTE — Telephone Encounter (Signed)
Scheduled follow-up appointment per 6/7 los. Patient is aware. 

## 2021-08-10 ENCOUNTER — Other Ambulatory Visit: Payer: Self-pay

## 2021-08-18 ENCOUNTER — Other Ambulatory Visit: Payer: Self-pay

## 2021-09-18 ENCOUNTER — Other Ambulatory Visit: Payer: Self-pay

## 2021-09-23 ENCOUNTER — Inpatient Hospital Stay: Payer: BC Managed Care – PPO

## 2021-09-23 ENCOUNTER — Encounter: Payer: Self-pay | Admitting: Hematology

## 2021-09-23 ENCOUNTER — Inpatient Hospital Stay: Payer: BC Managed Care – PPO | Attending: Hematology | Admitting: Hematology

## 2021-09-23 ENCOUNTER — Other Ambulatory Visit: Payer: Self-pay

## 2021-09-23 VITALS — BP 134/91 | HR 79 | Temp 98.5°F | Resp 16 | Ht 66.0 in | Wt 194.7 lb

## 2021-09-23 DIAGNOSIS — Z7984 Long term (current) use of oral hypoglycemic drugs: Secondary | ICD-10-CM | POA: Insufficient documentation

## 2021-09-23 DIAGNOSIS — Z85038 Personal history of other malignant neoplasm of large intestine: Secondary | ICD-10-CM | POA: Insufficient documentation

## 2021-09-23 DIAGNOSIS — C19 Malignant neoplasm of rectosigmoid junction: Secondary | ICD-10-CM | POA: Diagnosis not present

## 2021-09-23 DIAGNOSIS — D751 Secondary polycythemia: Secondary | ICD-10-CM | POA: Insufficient documentation

## 2021-09-23 LAB — COMPREHENSIVE METABOLIC PANEL
ALT: 25 U/L (ref 0–44)
AST: 20 U/L (ref 15–41)
Albumin: 4.6 g/dL (ref 3.5–5.0)
Alkaline Phosphatase: 54 U/L (ref 38–126)
Anion gap: 5 (ref 5–15)
BUN: 13 mg/dL (ref 6–20)
CO2: 27 mmol/L (ref 22–32)
Calcium: 9.5 mg/dL (ref 8.9–10.3)
Chloride: 108 mmol/L (ref 98–111)
Creatinine, Ser: 0.89 mg/dL (ref 0.61–1.24)
GFR, Estimated: >60 mL/min (ref >60)
Glucose, Bld: 102 mg/dL — ABNORMAL HIGH (ref 70–99)
Potassium: 3.9 mmol/L (ref 3.5–5.1)
Sodium: 140 mmol/L (ref 135–145)
Total Bilirubin: 0.4 mg/dL (ref 0.3–1.2)
Total Protein: 7.2 g/dL (ref 6.5–8.1)

## 2021-09-23 LAB — CBC WITH DIFFERENTIAL/PLATELET
Abs Immature Granulocytes: 0.01 10*3/uL (ref 0.00–0.07)
Basophils Absolute: 0 10*3/uL (ref 0.0–0.1)
Basophils Relative: 0 %
Eosinophils Absolute: 0 10*3/uL (ref 0.0–0.5)
Eosinophils Relative: 0 %
HCT: 41.6 % (ref 39.0–52.0)
Hemoglobin: 13.4 g/dL (ref 13.0–17.0)
Immature Granulocytes: 0 %
Lymphocytes Relative: 19 %
Lymphs Abs: 1.5 10*3/uL (ref 0.7–4.0)
MCH: 21.4 pg — ABNORMAL LOW (ref 26.0–34.0)
MCHC: 32.2 g/dL (ref 30.0–36.0)
MCV: 66.6 fL — ABNORMAL LOW (ref 80.0–100.0)
Monocytes Absolute: 0.6 10*3/uL (ref 0.1–1.0)
Monocytes Relative: 7 %
Neutro Abs: 5.8 10*3/uL (ref 1.7–7.7)
Neutrophils Relative %: 74 %
Platelets: 200 10*3/uL (ref 150–400)
RBC: 6.25 MIL/uL — ABNORMAL HIGH (ref 4.22–5.81)
RDW: 14.8 % (ref 11.5–15.5)
WBC: 7.9 10*3/uL (ref 4.0–10.5)
nRBC: 0 % (ref 0.0–0.2)

## 2021-09-23 LAB — CEA (IN HOUSE-CHCC): CEA (CHCC-In House): <1 ng/mL (ref 0.00–5.00)

## 2021-09-23 LAB — FERRITIN: Ferritin: 27 ng/mL (ref 24–336)

## 2021-09-23 NOTE — Progress Notes (Signed)
[place]   Telephone:(336) [phone] Fax:(336) [phone]   Clinic Follow up Note   Patient Care Team: Elwyn Reach, MD as PCP - General (Internal Medicine) Michael Boston, MD as Consulting Physician (Colon and Rectal Surgery) Carol Ada, MD as Consulting Physician (Gastroenterology) Truitt Merle, MD as Consulting Physician (Oncology)  Date of Service:  09/23/2021  CHIEF COMPLAINT: f/u of colon cancer  CURRENT THERAPY:  Surveillance  ASSESSMENT & PLAN:  Clifford [name] is a 46 y.o. male with   1. Cancer of rectosigmoid colon, pT2N1bM0, stage IIIA, MMR intact  -Diagnosed in 09/2020. S/p resection on 10/24/20 under Dr. Johney Maine, with path showing 3.2 cm invasive adenocarcinoma involving rectosigmoid colon invading into muscularis propria. Two lymph nodes were positive (2/28). Margins were negative. -he received 3 months of CAPOX, given q3weeks, 11/21/20 - 01/23/21. He tolerated well with mild cold sensitivity.  -surveillance CT AP on 06/19/21 showed NED. -He is clinically doing well. Labs reviewed, he has stable polycythemia. Otherwise WNL. Physical exam was unremarkable. There is no clinical concern for recurrence. -plan for repeat CT before next visit in 3 months.   2. Genetics -given his young age, genetic testing was recommended. He underwent testing on 11/18/20. Results were negative.     PLAN:  -f/u in 3 months with lab and CT AP w contrast a few days before   No problem-specific Assessment & Plan notes found for this encounter.   SUMMARY OF ONCOLOGIC HISTORY: Oncology History  Cancer of rectosigmoid (colon) ([place])  09/25/2020 Procedure   Colonoscopy, Dr. Benson Norway  Findings: -An infiltrative and ulcerated non-obstructing large mass was found in the sigmoid colon. The mass was non-circumferential. The mass measured 3 cm in length. Oozing was present. This was biopsied with a cold snare for histology. Areas was tattooed.  -Approximately 17 cm from the anal verge a  large mass was identified. There was a central ulceration and large rolled edges. The lesion was nonobstructing and it was less than 50% of the circumference. Multiple cold snare biopsies were obtained. The lesion was tattooed proximally and distally.   09/25/2020 Imaging   CT CAP  IMPRESSION: No definite abnormality is noted in the chest.  7 mm right common iliac lymph node is noted concerning for metastatic disease given the history of newly diagnosed colonic malignancy.  No other abnormality seen in the abdomen or pelvis.   09/25/2020 Pathology Results   FINAL DIAGNOSIS:  A. Colon, sigmoid, mass, biopsy  - INVASIVE COLONIC ADENOCARCINOMA, MODERATELY-DIFFERENTIATED.  Addendum: A. Colon, sigmoid, mass, biopsy  - IMMUNOHISTOCHEMICAL STAINS FOR MLH1, MSH2, MSH6, AND PMS2 ARE INTACT (NORMAL)   10/24/2020 Initial Diagnosis   Cancer of rectosigmoid (colon) ([place])   10/24/2020 Cancer Staging   Staging form: Colon and Rectum, AJCC 8th Edition - Pathologic stage from 10/24/2020: Stage IIIA (pT2, pN1b, cM0) - Signed by Truitt Merle, MD on 11/14/2020 Histologic grading system: 4 grade system Histologic grade (G): G2 Residual tumor (R): R0 - None   10/24/2020 Definitive Surgery   -XI ROBOTIC LOW ANTERIOR RESECTION , OMENTAL PEDICLE FLAP WITH OMENTOPEXY, PERFUSION ASSESSMENT USING FIREFLY, TAP BLOCK (Abdomen)  -RIGID PROCTOSCOPY (Rectum)   10/24/2020 Pathology Results   FINAL MICROSCOPIC DIAGNOSIS:   A. RECTOSIGMOID COLON, RESECTION:  - Invasive moderately differentiated adenocarcinoma, 3.2 cm, involving rectosigmoid colon  - Carcinoma invades into muscularis propria  - Metastatic carcinoma to two of twenty-eight lymph nodes (2/28)  - Resection margins are negative for carcinoma  - See oncology table   B. FINAL  DISTAL MARGIN, EXCISION:  - Colonic donut, negative for carcinoma   C. PERIAORTIC LYMPH NODE, EXCISION:  - Benign fibroadipose tissue  - Lymphoid tissue is not identified     11/18/2020 Genetic Testing   [employer] Common Cancer Panel is Negative. Report date was 12/16/2020.  The Common Hereditary Cancers + RNA Panel offered by [employer] includes sequencing, deletion/duplication, and RNA testing of the following 47 genes: APC, ATM, AXIN2, BARD1, BMPR1A, BRCA1, BRCA2, BRIP1, CDH1, CDK4*, CDKN2A (p14ARF)*, CDKN2A (p16INK4a)*, CHEK2, CTNNA1, DICER1, EPCAM (Deletion/duplication testing only), GREM1 (promoter region deletion/duplication testing only), KIT, MEN1, MLH1, MSH2, MSH3, MSH6, MUTYH, NBN, NF1, NHTL1, PALB2, PDGFRA*, PMS2, POLD1, POLE, PTEN, RAD50, RAD51C, RAD51D, SDHB, SDHC, SDHD, SMAD4, SMARCA4. STK11, TP53, TSC1, TSC2, and VHL.  The following genes were evaluated for sequence changes only: SDHA and HOXB13 c.251G>A variant only.  RNA analysis is not performed for the * genes.     03/19/2021 Imaging   EXAM: CT CHEST, ABDOMEN, AND PELVIS WITH CONTRAST  IMPRESSION: 1. 12 mm subtle focus of hypo attenuation in the inferior tip of the right liver. This is may be a focus of subcapsular fat deposition. MRI of the abdomen with and without contrast recommended to further evaluate. 2. 9 mm nodule in the midline omentum. Potentially related to surgery, continued close attention on follow-up recommended. 3. Large stool volume. Imaging features compatible with clinical constipation.   06/19/2021 Imaging   CT AP w contrast (3 mo f/up to 03/2021 CT) IMPRESSION: 1. Status post partial sigmoidectomy without evidence of local recurrence. 2. No convincing evidence of metastatic disease in the abdomen or pelvis. 3. Decreased size of the small omental nodule, favored postsurgical, continued attention on follow-up imaging suggested. 4. Previously identified subtle hepatic lesion is not seen on today's examination and favored benign.   Rectosigmoid cancer ([place])  11/14/2020 Initial Diagnosis   Rectosigmoid cancer ([place])   11/21/2020 - 01/23/2021 Chemotherapy   Patient is on Treatment Plan  : COLORECTAL Xelox (Capeox) q21d     03/19/2021 Imaging   EXAM: CT CHEST, ABDOMEN, AND PELVIS WITH CONTRAST  IMPRESSION: 1. 12 mm subtle focus of hypo attenuation in the inferior tip of the right liver. This is may be a focus of subcapsular fat deposition. MRI of the abdomen with and without contrast recommended to further evaluate. 2. 9 mm nodule in the midline omentum. Potentially related to surgery, continued close attention on follow-up recommended. 3. Large stool volume. Imaging features compatible with clinical constipation.      INTERVAL HISTORY:  Clifford [name] is here for a follow up of colon cancer. He was last seen by NP [name] on 06/24/21. He presents to the clinic alone. He reports some spasms to his lower abdomen.   All other systems were reviewed with the patient and are negative.  MEDICAL HISTORY:  Past Medical History:  Diagnosis Date   Colon cancer ([place])    Diabetes mellitus without complication ([place])    Pre-diabetes     SURGICAL HISTORY: Past Surgical History:  Procedure Laterality Date   COLON SURGERY  10/24/20   HERNIA REPAIR     PROCTOSCOPY N/A 10/24/2020   Procedure: RIGID PROCTOSCOPY;  Surgeon: Michael Boston, MD;  Location: [facility];  Service: General;  Laterality: N/A;    I have reviewed the social history and family history with the patient and they are unchanged from previous note.  ALLERGIES:  has No Known Allergies.  MEDICATIONS:  Current Outpatient Medications  Medication Sig Dispense Refill   metFORMIN (GLUCOPHAGE)  500 MG tablet Take 500 mg by mouth daily.     No current facility-administered medications for this visit.    PHYSICAL EXAMINATION: ECOG PERFORMANCE STATUS: 0 - Asymptomatic  Vitals:   09/23/21 0929  BP: (!) 134/91  Pulse: 79  Resp: 16  Temp: 98.5 F (36.9 C)  SpO2: 100%   Wt Readings from Last 3 Encounters:  09/23/21 194 lb 11.2 oz (88.3 kg)  06/24/21 194 lb 4.8 oz (88.1 kg)  03/23/21 197 lb 8 oz (89.6 kg)      GENERAL:alert, no distress and comfortable SKIN: skin color, texture, turgor are normal, no rashes or significant lesions EYES: normal, Conjunctiva are pink and non-injected, sclera clear  NECK: supple, thyroid normal size, non-tender, without nodularity LYMPH:  no palpable lymphadenopathy in the cervical, axillary LUNGS: clear to auscultation and percussion with normal breathing effort HEART: regular rate & rhythm and no murmurs and no lower extremity edema ABDOMEN:abdomen soft, non-tender and normal bowel sounds Musculoskeletal:no cyanosis of digits and no clubbing  NEURO: alert & oriented x 3 with fluent speech, no focal motor/sensory deficits  LABORATORY DATA:  I have reviewed the data as listed    Latest Ref Rng & Units 09/23/2021    9:07 AM 06/19/2021    7:56 AM 03/19/2021    8:41 AM  CBC  WBC 4.0 - 10.5 K/uL 7.9  5.2  4.4   Hemoglobin 13.0 - 17.0 g/dL 13.4  13.2  12.8   Hematocrit 39.0 - 52.0 % 41.6  41.2  40.7   Platelets 150 - 400 K/uL 200  202  226         Latest Ref Rng & Units 09/23/2021    9:07 AM 06/19/2021    7:56 AM 03/19/2021    8:41 AM  CMP  Glucose 70 - 99 mg/dL 102  124  112   BUN 6 - 20 mg/dL 13  11  10    Creatinine 0.61 - 1.24 mg/dL 0.89  0.94  0.89   Sodium 135 - 145 mmol/L 140  139  141   Potassium 3.5 - 5.1 mmol/L 3.9  4.0  4.3   Chloride 98 - 111 mmol/L 108  104  105   CO2 22 - 32 mmol/L 27  27  29    Calcium 8.9 - 10.3 mg/dL 9.5  10.0  9.7   Total Protein 6.5 - 8.1 g/dL 7.2  7.3  7.4   Total Bilirubin 0.3 - 1.2 mg/dL 0.4  0.4  0.4   Alkaline Phos 38 - 126 U/L 54  72  65   AST 15 - 41 U/L 20  22  24    ALT 0 - 44 U/L 25  34  26       RADIOGRAPHIC STUDIES: I have personally reviewed the radiological images as listed and agreed with the findings in the report. No results found.    Orders Placed This Encounter  Procedures   CT CHEST ABDOMEN PELVIS W CONTRAST    Standing Status:   Future    Standing Expiration Date:   09/24/2022    Order Specific  Question:   Preferred imaging location?    Answer:   [facility]    Order Specific Question:   Release to patient    Answer:   Immediate    Order Specific Question:   Is Oral Contrast requested for this exam?    Answer:   Yes, Per Radiology protocol   All questions were answered. The patient knows  to call the clinic with any problems, questions or concerns. No barriers to learning was detected.      Truitt Merle, MD 09/23/2021   I, Wilburn Mylar, am acting as scribe for Truitt Merle, MD.   I have reviewed the above documentation for accuracy and completeness, and I agree with the above.

## 2021-09-25 ENCOUNTER — Telehealth: Payer: Self-pay | Admitting: Hematology

## 2021-09-25 NOTE — Telephone Encounter (Signed)
Scheduled follow-up appointments per 9/6 los. Patient is aware. 

## 2021-11-13 ENCOUNTER — Other Ambulatory Visit: Payer: Self-pay

## 2021-11-13 ENCOUNTER — Other Ambulatory Visit: Payer: Self-pay | Admitting: Hematology

## 2021-11-13 DIAGNOSIS — C19 Malignant neoplasm of rectosigmoid junction: Secondary | ICD-10-CM

## 2021-12-21 ENCOUNTER — Other Ambulatory Visit: Payer: BC Managed Care – PPO

## 2021-12-22 ENCOUNTER — Inpatient Hospital Stay: Payer: BC Managed Care – PPO | Attending: Hematology

## 2021-12-22 ENCOUNTER — Ambulatory Visit (HOSPITAL_COMMUNITY)
Admission: RE | Admit: 2021-12-22 | Discharge: 2021-12-22 | Disposition: A | Discharge status: Home / Self Care | Payer: BC Managed Care – PPO | Source: Ambulatory Visit | Attending: Hematology | Admitting: Hematology

## 2021-12-22 ENCOUNTER — Other Ambulatory Visit: Payer: Self-pay

## 2021-12-22 DIAGNOSIS — C19 Malignant neoplasm of rectosigmoid junction: Secondary | ICD-10-CM | POA: Diagnosis present

## 2021-12-22 DIAGNOSIS — Z85038 Personal history of other malignant neoplasm of large intestine: Secondary | ICD-10-CM | POA: Insufficient documentation

## 2021-12-22 LAB — CMP (CANCER CENTER ONLY)
ALT: 48 U/L — ABNORMAL HIGH (ref 0–44)
AST: 33 U/L (ref 15–41)
Albumin: 4.8 g/dL (ref 3.5–5.0)
Alkaline Phosphatase: 63 U/L (ref 38–126)
Anion gap: 7 (ref 5–15)
BUN: 11 mg/dL (ref 6–20)
CO2: 30 mmol/L (ref 22–32)
Calcium: 10.4 mg/dL — ABNORMAL HIGH (ref 8.9–10.3)
Chloride: 104 mmol/L (ref 98–111)
Creatinine: 0.88 mg/dL (ref 0.61–1.24)
GFR, Estimated: >60 mL/min (ref >60)
Glucose, Bld: 88 mg/dL (ref 70–99)
Potassium: 3.7 mmol/L (ref 3.5–5.1)
Sodium: 141 mmol/L (ref 135–145)
Total Bilirubin: 0.3 mg/dL (ref 0.3–1.2)
Total Protein: 7.5 g/dL (ref 6.5–8.1)

## 2021-12-22 LAB — CBC WITH DIFFERENTIAL (CANCER CENTER ONLY)
Abs Immature Granulocytes: 0.01 K/uL (ref 0.00–0.07)
Basophils Absolute: 0 K/uL (ref 0.0–0.1)
Basophils Relative: 0 %
Eosinophils Absolute: 0.1 K/uL (ref 0.0–0.5)
Eosinophils Relative: 1 %
HCT: 44.4 % (ref 39.0–52.0)
Hemoglobin: 14.1 g/dL (ref 13.0–17.0)
Immature Granulocytes: 0 %
Lymphocytes Relative: 37 %
Lymphs Abs: 2.2 K/uL (ref 0.7–4.0)
MCH: 21.2 pg — ABNORMAL LOW (ref 26.0–34.0)
MCHC: 31.8 g/dL (ref 30.0–36.0)
MCV: 66.9 fL — ABNORMAL LOW (ref 80.0–100.0)
Monocytes Absolute: 0.7 K/uL (ref 0.1–1.0)
Monocytes Relative: 12 %
Neutro Abs: 2.9 K/uL (ref 1.7–7.7)
Neutrophils Relative %: 50 %
Platelet Count: 214 K/uL (ref 150–400)
RBC: 6.64 MIL/uL — ABNORMAL HIGH (ref 4.22–5.81)
RDW: 16.1 % — ABNORMAL HIGH (ref 11.5–15.5)
WBC Count: 5.9 K/uL (ref 4.0–10.5)
nRBC: 0 % (ref 0.0–0.2)

## 2021-12-22 MED ORDER — IOHEXOL 300 MG/ML  SOLN
100.0000 mL | Freq: Once | INTRAMUSCULAR | Status: AC | PRN
  Administered 2021-12-22: 100 mL via INTRAVENOUS

## 2021-12-24 ENCOUNTER — Encounter: Payer: Self-pay | Admitting: Hematology

## 2021-12-24 ENCOUNTER — Inpatient Hospital Stay (HOSPITAL_BASED_OUTPATIENT_CLINIC_OR_DEPARTMENT_OTHER): Payer: BC Managed Care – PPO | Admitting: Hematology

## 2021-12-24 VITALS — BP 132/89 | HR 72 | Temp 99.0°F | Resp 18 | Ht 66.0 in | Wt 198.2 lb

## 2021-12-24 DIAGNOSIS — Z1321 Encounter for screening for nutritional disorder: Secondary | ICD-10-CM

## 2021-12-24 DIAGNOSIS — Z85038 Personal history of other malignant neoplasm of large intestine: Secondary | ICD-10-CM | POA: Diagnosis present

## 2021-12-24 DIAGNOSIS — C19 Malignant neoplasm of rectosigmoid junction: Secondary | ICD-10-CM

## 2021-12-24 NOTE — Assessment & Plan Note (Addendum)
pT2N1bM0, stage IIIA, MMR intact  -Diagnosed in 09/2020. S/p resection on 10/24/20 under Dr. Johney Maine, with path showing 3.2 cm invasive adenocarcinoma involving rectosigmoid colon invading into muscularis propria. Two lymph nodes were positive (2/28). Margins were negative. -he received 3 months of CAPOX, given q3weeks, 11/21/20 - 01/23/21. He tolerated well with mild cold sensitivity.  -surveillance CT AP on 06/19/21 showed NED. -genetic testing negative in 11/2020 -continue cancer surveillance

## 2021-12-24 NOTE — Progress Notes (Signed)
[facility] [place]   Telephone:([phone]) [phone] Fax:(336) [phone]   Clinic Follow up Note   Patient Care Team: Elwyn Reach, MD as PCP - General (Internal Medicine) Michael Boston, MD as Consulting Physician (Colon and Rectal Surgery) Carol Ada, MD as Consulting Physician (Gastroenterology) Truitt Merle, MD as Consulting Physician (Oncology)  Date of Service:  12/24/2021  CHIEF COMPLAINT: f/u of colon cancer   CURRENT THERAPY:  Surveillance  ASSESSMENT:  Clifford [name] is a 46 y.o. 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 S/p resection on 10/24/20 under Dr. Johney Maine, with path showing 3.2 cm invasive adenocarcinoma involving rectosigmoid colon invading into muscularis propria. Two lymph nodes were positive (2/28). Margins were negative. -he received 3 months of CAPOX, given q3weeks, 11/21/20 - 01/23/21. He tolerated well with mild cold sensitivity.  -surveillance CT AP on 06/19/21 showed NED. -genetic testing negative in 11/2020 -continue cancer surveillance -CT scan from December 22, 2021 showed NED, plan to repeat again in a year.   PLAN: - Discuss surveillant CT Scan from December 22, 2021, which showed no evidence of recurrence. -lab,f/u with Clifford [name] in 4 months   SUMMARY OF ONCOLOGIC HISTORY: Oncology History  Cancer of rectosigmoid (colon) ([place])  09/25/2020 Procedure   Colonoscopy, Dr. Benson Norway  Findings: -An infiltrative and ulcerated non-obstructing large mass was found in the sigmoid colon. The mass was non-circumferential. The mass measured 3 cm in length. Oozing was present. This was biopsied with a cold snare for histology. Areas was tattooed.  -Approximately 17 cm from the anal verge a large mass was identified. There was a central ulceration and large rolled edges. The lesion was nonobstructing and it was less than 50% of the circumference. Multiple cold snare biopsies were obtained. The lesion was  tattooed proximally and distally.   09/25/2020 Imaging   CT CAP  IMPRESSION: No definite abnormality is noted in the chest.  7 mm right common iliac lymph node is noted concerning for metastatic disease given the history of newly diagnosed colonic malignancy.  No other abnormality seen in the abdomen or pelvis.   09/25/2020 Pathology Results   FINAL DIAGNOSIS:  A. Colon, sigmoid, mass, biopsy  - INVASIVE COLONIC ADENOCARCINOMA, MODERATELY-DIFFERENTIATED.  Addendum: A. Colon, sigmoid, mass, biopsy  - IMMUNOHISTOCHEMICAL STAINS FOR MLH1, MSH2, MSH6, AND PMS2 ARE INTACT (NORMAL)   10/24/2020 Initial Diagnosis   Cancer of rectosigmoid (colon) ([place])   10/24/2020 Cancer Staging   Staging form: Colon and Rectum, AJCC 8th Edition - Pathologic stage from 10/24/2020: Stage IIIA (pT2, pN1b, cM0) - Signed by Truitt Merle, MD on 11/14/2020 Histologic grading system: 4 grade system Histologic grade (G): G2 Residual tumor (R): R0 - None   10/24/2020 Definitive Surgery   -XI ROBOTIC LOW ANTERIOR RESECTION , OMENTAL PEDICLE FLAP WITH OMENTOPEXY, PERFUSION ASSESSMENT USING FIREFLY, TAP BLOCK (Abdomen)  -RIGID PROCTOSCOPY (Rectum)   10/24/2020 Pathology Results   FINAL MICROSCOPIC DIAGNOSIS:   A. RECTOSIGMOID COLON, RESECTION:  - Invasive moderately differentiated adenocarcinoma, 3.2 cm, involving rectosigmoid colon  - Carcinoma invades into muscularis propria  - Metastatic carcinoma to two of twenty-eight lymph nodes (2/28)  - Resection margins are negative for carcinoma  - See oncology table   B. FINAL DISTAL MARGIN, EXCISION:  - Colonic donut, negative for carcinoma   C. PERIAORTIC LYMPH NODE, EXCISION:  - Benign fibroadipose tissue  - Lymphoid tissue is not identified    11/18/2020 Genetic Testing   [employer] Common Cancer Panel  is Negative. Report date was 12/16/2020.  The Common Hereditary Cancers + RNA Panel offered by [employer] includes sequencing, deletion/duplication, and RNA testing of  the following 47 genes: APC, ATM, AXIN2, BARD1, BMPR1A, BRCA1, BRCA2, BRIP1, CDH1, CDK4*, CDKN2A (p14ARF)*, CDKN2A (p16INK4a)*, CHEK2, CTNNA1, DICER1, EPCAM (Deletion/duplication testing only), GREM1 (promoter region deletion/duplication testing only), KIT, MEN1, MLH1, MSH2, MSH3, MSH6, MUTYH, NBN, NF1, NHTL1, PALB2, PDGFRA*, PMS2, POLD1, POLE, PTEN, RAD50, RAD51C, RAD51D, SDHB, SDHC, SDHD, SMAD4, SMARCA4. STK11, TP53, TSC1, TSC2, and VHL.  The following genes were evaluated for sequence changes only: SDHA and HOXB13 c.251G>A variant only.  RNA analysis is not performed for the * genes.     03/19/2021 Imaging   EXAM: CT CHEST, ABDOMEN, AND PELVIS WITH CONTRAST  IMPRESSION: 1. 12 mm subtle focus of hypo attenuation in the inferior tip of the right liver. This is may be a focus of subcapsular fat deposition. MRI of the abdomen with and without contrast recommended to further evaluate. 2. 9 mm nodule in the midline omentum. Potentially related to surgery, continued close attention on follow-up recommended. 3. Large stool volume. Imaging features compatible with clinical constipation.   06/19/2021 Imaging   CT AP w contrast (3 mo f/up to 03/2021 CT) IMPRESSION: 1. Status post partial sigmoidectomy without evidence of local recurrence. 2. No convincing evidence of metastatic disease in the abdomen or pelvis. 3. Decreased size of the small omental nodule, favored postsurgical, continued attention on follow-up imaging suggested. 4. Previously identified subtle hepatic lesion is not seen on today's examination and favored benign.   12/22/2021 Imaging    IMPRESSION: 1. Surgical changes of partial sigmoidectomy with rectosigmoid anastomosis common no evidence of local recurrence. 2. No evidence of new or progressive disease in the chest, abdomen, or pelvis. 3. Interval resolution of the tiny omental nodule seen on prior studies.   Rectosigmoid cancer ([place])  11/14/2020 Initial Diagnosis    Rectosigmoid cancer ([place])   11/21/2020 - 01/23/2021 Chemotherapy   Patient is on Treatment Plan : COLORECTAL Xelox (Capeox) q21d     03/19/2021 Imaging   EXAM: CT CHEST, ABDOMEN, AND PELVIS WITH CONTRAST  IMPRESSION: 1. 12 mm subtle focus of hypo attenuation in the inferior tip of the right liver. This is may be a focus of subcapsular fat deposition. MRI of the abdomen with and without contrast recommended to further evaluate. 2. 9 mm nodule in the midline omentum. Potentially related to surgery, continued close attention on follow-up recommended. 3. Large stool volume. Imaging features compatible with clinical constipation.      INTERVAL HISTORY:  Clifford [name] is here for a follow up of colon cancer   He was last seen by me on 09/23/2021  He presents to the clinic alone. Pt reports that he notice that he has  been getting a lot of upper respiratory infections. Took antibiotics.Pt denies numbness and tingling In fingers. Overall doing well.    All other systems were reviewed with the patient and are negative.  MEDICAL HISTORY:  Past Medical History:  Diagnosis Date   Colon cancer ([place])    Diabetes mellitus without complication ([place])    Pre-diabetes     SURGICAL HISTORY: Past Surgical History:  Procedure Laterality Date   COLON SURGERY  10/24/20   HERNIA REPAIR     PROCTOSCOPY N/A 10/24/2020   Procedure: RIGID PROCTOSCOPY;  Surgeon: Michael Boston, MD;  Location: [facility];  Service: General;  Laterality: N/A;    I have reviewed the social history and family history with  the patient and they are unchanged from previous note.  ALLERGIES:  has No Known Allergies.  MEDICATIONS:  Current Outpatient Medications  Medication Sig Dispense Refill   metFORMIN (GLUCOPHAGE) 500 MG tablet Take 500 mg by mouth daily.     No current facility-administered medications for this visit.    PHYSICAL EXAMINATION: ECOG PERFORMANCE STATUS: 0 - Asymptomatic  Vitals:   12/24/21 1450  BP:  132/89  Pulse: 72  Resp: 18  Temp: 99 F (37.2 C)  SpO2: 100%   Wt Readings from Last 3 Encounters:  12/24/21 198 lb 3.2 oz (89.9 kg)  09/23/21 194 lb 11.2 oz (88.3 kg)  06/24/21 194 lb 4.8 oz (88.1 kg)     GENERAL:alert, no distress and comfortable SKIN: skin color normal, no rashes or significant lesions EYES: normal, Conjunctiva are pink and non-injected, sclera clear  NEURO: alert & oriented x 3 with fluent speech  LABORATORY DATA:  I have reviewed the data as listed    Latest Ref Rng & Units 12/22/2021    3:44 PM 09/23/2021    9:07 AM 06/19/2021    7:56 AM  CBC  WBC 4.0 - 10.5 K/uL 5.9  7.9  5.2   Hemoglobin 13.0 - 17.0 g/dL 14.1  13.4  13.2   Hematocrit 39.0 - 52.0 % 44.4  41.6  41.2   Platelets 150 - 400 K/uL 214  200  202         Latest Ref Rng & Units 12/22/2021    3:44 PM 09/23/2021    9:07 AM 06/19/2021    7:56 AM  CMP  Glucose 70 - 99 mg/dL 88  102  124   BUN 6 - 20 mg/dL _0 Creatinine 0.61 - 1.24 mg/dL 0.88  0.89  0.94   Sodium 135 - 145 mmol/L 141  140  139   Potassium 3.5 - 5.1 mmol/L 3.7  3.9  4.0   Chloride 98 - 111 mmol/L 104  108  104   CO2 22 - 32 mmol/L _1 Calcium 8.9 - 10.3 mg/dL 10.4  9.5  10.0   Total Protein 6.5 - 8.1 g/dL 7.5  7.2  7.3   Total Bilirubin 0.3 - 1.2 mg/dL 0.3  0.4  0.4   Alkaline Phos 38 - 126 U/L 63  54  72   AST 15 - 41 U/L 33  20  22   ALT 0 - 44 U/L 48  25  34       RADIOGRAPHIC STUDIES: I have personally reviewed the radiological images as listed and agreed with the findings in the report. CT CHEST ABDOMEN PELVIS W CONTRAST  Result Date: 12/22/2021 CLINICAL DATA:  Colon cancer, stage II/III. Monitor * Tracking Code: BO * EXAM: CT CHEST, ABDOMEN, AND PELVIS WITH CONTRAST TECHNIQUE: Multidetector CT imaging of the chest, abdomen and pelvis was performed following the standard protocol during bolus administration of intravenous contrast. RADIATION DOSE REDUCTION: This exam was performed according to the  departmental dose-optimization program which includes automated exposure control, adjustment of the mA and/or kV according to patient size and/or use of iterative reconstruction technique. CONTRAST:  111m OMNIPAQUE IOHEXOL 300 MG/ML  SOLN COMPARISON:  Multiple priors including CT March 19, 2021 and June 19, 2021. FINDINGS: CT CHEST FINDINGS Cardiovascular: Normal caliber thoracic aorta. Gas in the pulmonary outflow tract from venous intervention. No central pulmonary embolus on this nondedicated study. Normal size heart. No significant pericardial  effusion/thickening. Mediastinum/Nodes: No supraclavicular adenopathy. No suspicious thyroid nodule. No pathologically enlarged mediastinal, hilar or axillary lymph nodes. The esophagus is grossly unremarkable. Lungs/Pleura: No suspicious pulmonary nodules or masses. Hypoventilatory change in the dependent lungs. No pleural effusion. No pneumothorax. Musculoskeletal: No aggressive lytic or blastic lesion of bone. CT ABDOMEN PELVIS FINDINGS Hepatobiliary: No suspicious hepatic lesion. Gallbladder is unremarkable. No biliary ductal dilation. Pancreas: No pancreatic ductal dilation or evidence of acute inflammation. Spleen: No splenomegaly or focal splenic lesion. Adrenals/Urinary Tract: Bilateral adrenal glands appear normal. No hydronephrosis. Kidneys demonstrate symmetric enhancement. Urinary bladder is unremarkable for degree of distension. Stomach/Bowel: Radiopaque enteric contrast material traverses distal loops of small bowel. The stomach is unremarkable for degree of distension. No pathologic dilation of small or large bowel. Bowel the appendix and terminal ileum appear normal. No evidence of acute bowel inflammation. Prior partial sigmoidectomy with rectosigmoid anastomosis common no suspicious soft tissue nodularity along the anastomotic suture line. Vascular/Lymphatic: Normal caliber abdominal aorta. No pathologically enlarged abdominal or pelvic lymph nodes.  Reproductive: Prostate is unremarkable. Other: No significant abdominopelvic free fluid. Interval resolution of the omental nodule seen on prior examinations. No new discrete omental or peritoneal nodularity. Musculoskeletal: No aggressive lytic or blastic lesion of bone. IMPRESSION: 1. Surgical changes of partial sigmoidectomy with rectosigmoid anastomosis common no evidence of local recurrence. 2. No evidence of new or progressive disease in the chest, abdomen, or pelvis. 3. Interval resolution of the tiny omental nodule seen on prior studies. Electronically Signed   By: Dahlia Bailiff M.D.   On: 12/22/2021 17:08      Orders Placed This Encounter  Procedures   CEA (IN HOUSE-[facility])    Standing Status:   Standing    Number of Occurrences:   5    Standing Expiration Date:   12/25/2022   VITAMIN D 25 Hydroxy (Vit-D Deficiency, Fractures)    Standing Status:   Future    Standing Expiration Date:   12/25/2022   Comprehensive metabolic panel    Standing Status:   Standing    Number of Occurrences:   50    Standing Expiration Date:   12/25/2022   CBC with Differential/Platelet    Standing Status:   Standing    Number of Occurrences:   50    Standing Expiration Date:   12/25/2022   All questions were answered. The patient knows to call the clinic with any problems, questions or concerns. No barriers to learning was detected. The total time spent in the appointment was 25 minutes.     Truitt Merle, MD 12/24/2021   Felicity Coyer, CMA, am acting as scribe for Truitt Merle, MD.   I have reviewed the above documentation for accuracy and completeness, and I agree with the above.

## 2022-04-22 ENCOUNTER — Encounter: Payer: Self-pay | Admitting: Nurse Practitioner

## 2022-04-22 ENCOUNTER — Inpatient Hospital Stay: Payer: BC Managed Care – PPO | Attending: Nurse Practitioner

## 2022-04-22 ENCOUNTER — Inpatient Hospital Stay (HOSPITAL_BASED_OUTPATIENT_CLINIC_OR_DEPARTMENT_OTHER): Payer: BC Managed Care – PPO | Admitting: Nurse Practitioner

## 2022-04-22 VITALS — BP 137/80 | HR 58 | Temp 97.5°F | Resp 20 | Wt 200.5 lb

## 2022-04-22 DIAGNOSIS — Z9221 Personal history of antineoplastic chemotherapy: Secondary | ICD-10-CM | POA: Diagnosis not present

## 2022-04-22 DIAGNOSIS — C19 Malignant neoplasm of rectosigmoid junction: Secondary | ICD-10-CM

## 2022-04-22 DIAGNOSIS — R718 Other abnormality of red blood cells: Secondary | ICD-10-CM | POA: Insufficient documentation

## 2022-04-22 DIAGNOSIS — E559 Vitamin D deficiency, unspecified: Secondary | ICD-10-CM | POA: Insufficient documentation

## 2022-04-22 DIAGNOSIS — Z85038 Personal history of other malignant neoplasm of large intestine: Secondary | ICD-10-CM | POA: Insufficient documentation

## 2022-04-22 DIAGNOSIS — Z1321 Encounter for screening for nutritional disorder: Secondary | ICD-10-CM

## 2022-04-22 LAB — COMPREHENSIVE METABOLIC PANEL
ALT: 26 U/L (ref 0–44)
AST: 23 U/L (ref 15–41)
Albumin: 4.7 g/dL (ref 3.5–5.0)
Alkaline Phosphatase: 58 U/L (ref 38–126)
Anion gap: 7 (ref 5–15)
BUN: 10 mg/dL (ref 6–20)
CO2: 28 mmol/L (ref 22–32)
Calcium: 10.2 mg/dL (ref 8.9–10.3)
Chloride: 105 mmol/L (ref 98–111)
Creatinine, Ser: 0.89 mg/dL (ref 0.61–1.24)
GFR, Estimated: >60 mL/min (ref >60)
Glucose, Bld: 115 mg/dL — ABNORMAL HIGH (ref 70–99)
Potassium: 4.2 mmol/L (ref 3.5–5.1)
Sodium: 140 mmol/L (ref 135–145)
Total Bilirubin: 0.4 mg/dL (ref 0.3–1.2)
Total Protein: 7.7 g/dL (ref 6.5–8.1)

## 2022-04-22 LAB — CBC WITH DIFFERENTIAL/PLATELET
Abs Immature Granulocytes: 0.01 10*3/uL (ref 0.00–0.07)
Basophils Absolute: 0 10*3/uL (ref 0.0–0.1)
Basophils Relative: 0 %
Eosinophils Absolute: 0 10*3/uL (ref 0.0–0.5)
Eosinophils Relative: 0 %
HCT: 43.6 % (ref 39.0–52.0)
Hemoglobin: 13.6 g/dL (ref 13.0–17.0)
Immature Granulocytes: 0 %
Lymphocytes Relative: 34 %
Lymphs Abs: 1.8 10*3/uL (ref 0.7–4.0)
MCH: 20.8 pg — ABNORMAL LOW (ref 26.0–34.0)
MCHC: 31.2 g/dL (ref 30.0–36.0)
MCV: 66.7 fL — ABNORMAL LOW (ref 80.0–100.0)
Monocytes Absolute: 0.3 10*3/uL (ref 0.1–1.0)
Monocytes Relative: 7 %
Neutro Abs: 3.1 10*3/uL (ref 1.7–7.7)
Neutrophils Relative %: 59 %
Platelets: 218 10*3/uL (ref 150–400)
RBC: 6.54 MIL/uL — ABNORMAL HIGH (ref 4.22–5.81)
RDW: 14.4 % (ref 11.5–15.5)
WBC: 5.3 10*3/uL (ref 4.0–10.5)
nRBC: 0 % (ref 0.0–0.2)

## 2022-04-22 LAB — CEA (IN HOUSE-CHCC): CEA (CHCC-In House): 1.38 ng/mL (ref 0.00–5.00)

## 2022-04-22 LAB — VITAMIN D 25 HYDROXY (VIT D DEFICIENCY, FRACTURES): Vit D, 25-Hydroxy: 28.68 ng/mL — ABNORMAL LOW (ref 30–100)

## 2022-04-22 NOTE — Progress Notes (Signed)
Patient Care Team: Clifford Reach, MD as PCP - General (Internal Medicine) Michael Boston, MD as Consulting Physician (Colon and Rectal Surgery) Carol Ada, MD as Consulting Physician (Gastroenterology) Truitt Merle, MD as Consulting Physician (Oncology)   CHIEF COMPLAINT: Follow-up colon cancer  Oncology History  Cancer of rectosigmoid (colon)  09/25/2020 Procedure   Colonoscopy, Dr. Benson Norway  Findings: -An infiltrative and ulcerated non-obstructing large mass was found in the sigmoid colon. The mass was non-circumferential. The mass measured 3 cm in length. Oozing was present. This was biopsied with a cold snare for histology. Areas was tattooed.  -Approximately 17 cm from the anal verge a large mass was identified. There was a central ulceration and large rolled edges. The lesion was nonobstructing and it was less than 50% of the circumference. Multiple cold snare biopsies were obtained. The lesion was tattooed proximally and distally.   09/25/2020 Imaging   CT CAP  IMPRESSION: No definite abnormality is noted in the chest.  7 mm right common iliac lymph node is noted concerning for metastatic disease given the history of newly diagnosed colonic malignancy.  No other abnormality seen in the abdomen or pelvis.   09/25/2020 Pathology Results   FINAL DIAGNOSIS:  A. Colon, sigmoid, mass, biopsy  - INVASIVE COLONIC ADENOCARCINOMA, MODERATELY-DIFFERENTIATED.  Addendum: A. Colon, sigmoid, mass, biopsy  - IMMUNOHISTOCHEMICAL STAINS FOR MLH1, MSH2, MSH6, AND PMS2 ARE INTACT (NORMAL)   10/24/2020 Initial Diagnosis   Cancer of rectosigmoid (colon) ([place])   10/24/2020 Cancer Staging   Staging form: Colon and Rectum, AJCC 8th Edition - Pathologic stage from 10/24/2020: Stage IIIA (pT2, pN1b, cM0) - Signed by Truitt Merle, MD on 11/14/2020 Histologic grading system: 4 grade system Histologic grade (G): G2 Residual tumor (R): R0 - None   10/24/2020 Definitive Surgery   -XI ROBOTIC LOW  ANTERIOR RESECTION , OMENTAL PEDICLE FLAP WITH OMENTOPEXY, PERFUSION ASSESSMENT USING FIREFLY, TAP BLOCK (Abdomen)  -RIGID PROCTOSCOPY (Rectum)   10/24/2020 Pathology Results   FINAL MICROSCOPIC DIAGNOSIS:   A. RECTOSIGMOID COLON, RESECTION:  - Invasive moderately differentiated adenocarcinoma, 3.2 cm, involving rectosigmoid colon  - Carcinoma invades into muscularis propria  - Metastatic carcinoma to two of twenty-eight lymph nodes (2/28)  - Resection margins are negative for carcinoma  - See oncology table   B. FINAL DISTAL MARGIN, EXCISION:  - Colonic donut, negative for carcinoma   C. PERIAORTIC LYMPH NODE, EXCISION:  - Benign fibroadipose tissue  - Lymphoid tissue is not identified    11/18/2020 Genetic Testing   [employer] Common Cancer Panel is Negative. Report date was 12/16/2020.  The Common Hereditary Cancers + RNA Panel offered by [employer] includes sequencing, deletion/duplication, and RNA testing of the following 47 genes: APC, ATM, AXIN2, BARD1, BMPR1A, BRCA1, BRCA2, BRIP1, CDH1, CDK4*, CDKN2A (p14ARF)*, CDKN2A (p16INK4a)*, CHEK2, CTNNA1, DICER1, EPCAM (Deletion/duplication testing only), GREM1 (promoter region deletion/duplication testing only), KIT, MEN1, MLH1, MSH2, MSH3, MSH6, MUTYH, NBN, NF1, NHTL1, PALB2, PDGFRA*, PMS2, POLD1, POLE, PTEN, RAD50, RAD51C, RAD51D, SDHB, SDHC, SDHD, SMAD4, SMARCA4. STK11, TP53, TSC1, TSC2, and VHL.  The following genes were evaluated for sequence changes only: SDHA and HOXB13 c.251G>A variant only.  RNA analysis is not performed for the * genes.     03/19/2021 Imaging   EXAM: CT CHEST, ABDOMEN, AND PELVIS WITH CONTRAST  IMPRESSION: 1. 12 mm subtle focus of hypo attenuation in the inferior tip of the right liver. This is may be a focus of subcapsular fat deposition. MRI of the abdomen with and without contrast recommended  to further evaluate. 2. 9 mm nodule in the midline omentum. Potentially related to surgery, continued close attention on  follow-up recommended. 3. Large stool volume. Imaging features compatible with clinical constipation.   06/19/2021 Imaging   CT AP w contrast (3 mo f/up to 03/2021 CT) IMPRESSION: 1. Status post partial sigmoidectomy without evidence of local recurrence. 2. No convincing evidence of metastatic disease in the abdomen or pelvis. 3. Decreased size of the small omental nodule, favored postsurgical, continued attention on follow-up imaging suggested. 4. Previously identified subtle hepatic lesion is not seen on today's examination and favored benign.   12/22/2021 Imaging    IMPRESSION: 1. Surgical changes of partial sigmoidectomy with rectosigmoid anastomosis common no evidence of local recurrence. 2. No evidence of new or progressive disease in the chest, abdomen, or pelvis. 3. Interval resolution of the tiny omental nodule seen on prior studies.   Rectosigmoid cancer  11/14/2020 Initial Diagnosis   Rectosigmoid cancer ([place])   11/21/2020 - 01/23/2021 Chemotherapy   Patient is on Treatment Plan : COLORECTAL Xelox (Capeox) q21d     03/19/2021 Imaging   EXAM: CT CHEST, ABDOMEN, AND PELVIS WITH CONTRAST  IMPRESSION: 1. 12 mm subtle focus of hypo attenuation in the inferior tip of the right liver. This is may be a focus of subcapsular fat deposition. MRI of the abdomen with and without contrast recommended to further evaluate. 2. 9 mm nodule in the midline omentum. Potentially related to surgery, continued close attention on follow-up recommended. 3. Large stool volume. Imaging features compatible with clinical constipation.      CURRENT THERAPY: Surveillance  INTERVAL HISTORY Clifford [name] returns for follow-up as scheduled, last seen by Dr. Burr Medico 12/24/21 for surveillance visit.  He is doing well without significant changes in his health.  He feels more sensitive/susceptible to common germs.  Denies any residual side effects from chemo.  He stopped taking multivitamin after last visit due  to high calcium.  Denies change in appetite, intentional weight loss, decreased energy, change in bowel habits, blood in stools, or abdominal pain/bloating.  ROS  All other systems reviewed and negative  Past Medical History:  Diagnosis Date   Colon cancer ([place])    Diabetes mellitus without complication ([place])    Pre-diabetes      Past Surgical History:  Procedure Laterality Date   COLON SURGERY  10/24/20   HERNIA REPAIR     PROCTOSCOPY N/A 10/24/2020   Procedure: RIGID PROCTOSCOPY;  Surgeon: Michael Boston, MD;  Location: [facility];  Service: General;  Laterality: N/A;     Outpatient Encounter Medications as of 04/22/2022  Medication Sig   metFORMIN (GLUCOPHAGE) 500 MG tablet Take 500 mg by mouth daily.   No facility-administered encounter medications on file as of 04/22/2022.     Today's Vitals   04/22/22 0915  BP: 137/80  Pulse: (!) 58  Resp: 20  Temp: (!) 97.5 F (36.4 C)  SpO2: 100%  Weight: 200 lb 8 oz (90.9 kg)   Body mass index is 32.36 kg/m.   PHYSICAL EXAM GENERAL:alert, no distress and comfortable SKIN: no rash  EYES: sclera clear NECK: without mass LYMPH:  no palpable cervical or supraclavicular lymphadenopathy  LUNGS: clear with normal breathing effort HEART: regular rate & rhythm, no lower extremity edema ABDOMEN: abdomen soft, non-tender and normal bowel sounds NEURO: alert & oriented x 3 with fluent speech     CBC    Component Value Date/Time   WBC 5.3 04/22/2022 0914   RBC 6.54 (H)  04/22/2022 0914   HGB 13.6 04/22/2022 0914   HGB 14.1 12/22/2021 1544   HCT 43.6 04/22/2022 0914   PLT 218 04/22/2022 0914   PLT 214 12/22/2021 1544   MCV 66.7 (L) 04/22/2022 0914   MCH 20.8 (L) 04/22/2022 0914   MCHC 31.2 04/22/2022 0914   RDW 14.4 04/22/2022 0914   LYMPHSABS 1.8 04/22/2022 0914   MONOABS 0.3 04/22/2022 0914   EOSABS 0.0 04/22/2022 0914   BASOSABS 0.0 04/22/2022 0914     CMP     Component Value Date/Time   NA 140 04/22/2022 0914   K 4.2  04/22/2022 0914   CL 105 04/22/2022 0914   CO2 28 04/22/2022 0914   GLUCOSE 115 (H) 04/22/2022 0914   BUN 10 04/22/2022 0914   CREATININE 0.89 04/22/2022 0914   CREATININE 0.88 12/22/2021 1544   CALCIUM 10.2 04/22/2022 0914   PROT 7.7 04/22/2022 0914   ALBUMIN 4.7 04/22/2022 0914   AST 23 04/22/2022 0914   AST 33 12/22/2021 1544   ALT 26 04/22/2022 0914   ALT 48 (H) 12/22/2021 1544   ALKPHOS 58 04/22/2022 0914   BILITOT 0.4 04/22/2022 0914   BILITOT 0.3 12/22/2021 1544   GFRNONAA >60 04/22/2022 0914   GFRNONAA >60 12/22/2021 1544     ASSESSMENT & PLAN:Clifford [name] is a 47 y.o. male with    1. Cancer of rectosigmoid colon, pT2N1bM0, stage IIIA, MMR intact  -Diagnosed 09/25/20 (Dr. Benson Norway), S/p resection 10/24/20 (Dr. Johney Maine) and 3 months adjuvant CAPOX 11/21/20-01/23/2021.  He tolerated well. -Per patient last colonoscopy 09/2021 by Dr. Benson Norway was unremarkable, he thinks he is due again in 3 or 5 years.  We will request the report -Last surveillance scan 12/2021 NED, repeat 12/2022 -Clifford [name] is clinically doing well.  No red flags.  Exam is benign, labs are unremarkable.  We will follow-up the pending vitamin D and CEA from today -Continue colon cancer surveillance -He will return for lab and follow-up in 4 months, or sooner if needed.  Will order CT on next visit   2. 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  - His oldest son is affected with sickle cell disease and underwent transplant, donor was his youngest child.  The middle child is unaffected -I encouraged him to continue healthy active lifestyle      PLAN: -Labs reviewed, F/up pending CEA and vit D -Continue colon cancer surveillance -OK to resume Mens daily multivitamin MWF (due to elevated  calcium) -Request colonoscopy report from Dr. Benson Norway -Lab and f/up in 4 months, or sooner if needed (will order surveillance CT for 12/2022 at next visit)    All questions were answered. The patient knows to call the clinic with any problems, questions or concerns. No barriers to learning were detected.   Cira Rue, NP-C 04/22/2022

## 2022-04-23 ENCOUNTER — Telehealth: Payer: Self-pay | Admitting: Nurse Practitioner

## 2022-04-23 ENCOUNTER — Other Ambulatory Visit: Payer: Self-pay

## 2022-04-23 ENCOUNTER — Encounter: Payer: Self-pay | Admitting: Nurse Practitioner

## 2022-04-25 ENCOUNTER — Other Ambulatory Visit: Payer: Self-pay | Admitting: Nurse Practitioner

## 2022-04-25 MED ORDER — ERGOCALCIFEROL 1.25 MG (50000 UT) PO CAPS: 50000.0000 [IU] | ORAL_CAPSULE | ORAL | 0 refills | Status: DC

## 2022-04-26 ENCOUNTER — Telehealth: Payer: Self-pay | Admitting: *Deleted

## 2022-04-26 NOTE — Telephone Encounter (Signed)
Faxed request for colonoscopy records to Dr. Lennie Odor office [phone].  Patient's ROI attached to fax. Fax confirmation received

## 2022-05-26 ENCOUNTER — Encounter: Payer: Self-pay | Admitting: Hematology

## 2022-07-15 ENCOUNTER — Other Ambulatory Visit: Payer: Self-pay | Admitting: Nurse Practitioner

## 2022-07-15 ENCOUNTER — Other Ambulatory Visit: Payer: Self-pay

## 2022-08-22 NOTE — Progress Notes (Unsigned)
Patient Care Team: Rometta Emery, MD as PCP - General (Internal Medicine) Karie Soda, MD as Consulting Physician (Colon and Rectal Surgery) Jeani Hawking, MD as Consulting Physician (Gastroenterology) Malachy Mood, MD as Consulting Physician (Oncology)   CHIEF COMPLAINT: Follow up colon cancer   Oncology History  Cancer of rectosigmoid (colon) (HCC)  09/25/2020 Procedure   Colonoscopy, Dr. Elnoria Howard  Findings: -An infiltrative and ulcerated non-obstructing large mass was found in the sigmoid colon. The mass was non-circumferential. The mass measured 3 cm in length. Oozing was present. This was biopsied with a cold snare for histology. Areas was tattooed.  -Approximately 17 cm from the anal verge a large mass was identified. There was a central ulceration and large rolled edges. The lesion was nonobstructing and it was less than 50% of the circumference. Multiple cold snare biopsies were obtained. The lesion was tattooed proximally and distally.   09/25/2020 Imaging   CT CAP  IMPRESSION: No definite abnormality is noted in the chest.  7 mm right common iliac lymph node is noted concerning for metastatic disease given the history of newly diagnosed colonic malignancy.  No other abnormality seen in the abdomen or pelvis.   09/25/2020 Pathology Results   FINAL DIAGNOSIS:  A. Colon, sigmoid, mass, biopsy  - INVASIVE COLONIC ADENOCARCINOMA, MODERATELY-DIFFERENTIATED.  Addendum: A. Colon, sigmoid, mass, biopsy  - IMMUNOHISTOCHEMICAL STAINS FOR MLH1, MSH2, MSH6, AND PMS2 ARE INTACT (NORMAL)   10/24/2020 Initial Diagnosis   Cancer of rectosigmoid (colon) (HCC)   10/24/2020 Cancer Staging   Staging form: Colon and Rectum, AJCC 8th Edition - Pathologic stage from 10/24/2020: Stage IIIA (pT2, pN1b, cM0) - Signed by Malachy Mood, MD on 11/14/2020 Histologic grading system: 4 grade system Histologic grade (G): G2 Residual tumor (R): R0 - None   10/24/2020 Definitive Surgery   -XI ROBOTIC  LOW ANTERIOR RESECTION , OMENTAL PEDICLE FLAP WITH OMENTOPEXY, PERFUSION ASSESSMENT USING FIREFLY, TAP BLOCK (Abdomen)  -RIGID PROCTOSCOPY (Rectum)   10/24/2020 Pathology Results   FINAL MICROSCOPIC DIAGNOSIS:   A. RECTOSIGMOID COLON, RESECTION:  - Invasive moderately differentiated adenocarcinoma, 3.2 cm, involving rectosigmoid colon  - Carcinoma invades into muscularis propria  - Metastatic carcinoma to two of twenty-eight lymph nodes (2/28)  - Resection margins are negative for carcinoma  - See oncology table   B. FINAL DISTAL MARGIN, EXCISION:  - Colonic donut, negative for carcinoma   C. PERIAORTIC LYMPH NODE, EXCISION:  - Benign fibroadipose tissue  - Lymphoid tissue is not identified    11/18/2020 Genetic Testing   [facility] Common Cancer Panel is Negative. Report date was 12/16/2020.  The Common Hereditary Cancers + RNA Panel offered by [employer] includes sequencing, deletion/duplication, and RNA testing of the following 47 genes: APC, ATM, AXIN2, BARD1, BMPR1A, BRCA1, BRCA2, BRIP1, CDH1, CDK4*, CDKN2A (p14ARF)*, CDKN2A (p16INK4a)*, CHEK2, CTNNA1, DICER1, EPCAM (Deletion/duplication testing only), GREM1 (promoter region deletion/duplication testing only), KIT, MEN1, MLH1, MSH2, MSH3, MSH6, MUTYH, NBN, NF1, NHTL1, PALB2, PDGFRA*, PMS2, POLD1, POLE, PTEN, RAD50, RAD51C, RAD51D, SDHB, SDHC, SDHD, SMAD4, SMARCA4. STK11, TP53, TSC1, TSC2, and VHL.  The following genes were evaluated for sequence changes only: SDHA and HOXB13 c.251G>A variant only.  RNA analysis is not performed for the * genes.     03/19/2021 Imaging   EXAM: CT CHEST, ABDOMEN, AND PELVIS WITH CONTRAST  IMPRESSION: 1. 12 mm subtle focus of hypo attenuation in the inferior tip of the right liver. This is may be a focus of subcapsular fat deposition. MRI of the abdomen with and  without contrast recommended to further evaluate. 2. 9 mm nodule in the midline omentum. Potentially related to surgery, continued close attention  on follow-up recommended. 3. Large stool volume. Imaging features compatible with clinical constipation.   06/19/2021 Imaging   CT AP w contrast (3 mo f/up to 03/2021 CT) IMPRESSION: 1. Status post partial sigmoidectomy without evidence of local recurrence. 2. No convincing evidence of metastatic disease in the abdomen or pelvis. 3. Decreased size of the small omental nodule, favored postsurgical, continued attention on follow-up imaging suggested. 4. Previously identified subtle hepatic lesion is not seen on today's examination and favored benign.   12/22/2021 Imaging    IMPRESSION: 1. Surgical changes of partial sigmoidectomy with rectosigmoid anastomosis common no evidence of local recurrence. 2. No evidence of new or progressive disease in the chest, abdomen, or pelvis. 3. Interval resolution of the tiny omental nodule seen on prior studies.   Rectosigmoid cancer (HCC)  11/14/2020 Initial Diagnosis   Rectosigmoid cancer (HCC)   11/21/2020 - 01/23/2021 Chemotherapy   Patient is on Treatment Plan : COLORECTAL Xelox (Capeox) q21d     03/19/2021 Imaging   EXAM: CT CHEST, ABDOMEN, AND PELVIS WITH CONTRAST  IMPRESSION: 1. 12 mm subtle focus of hypo attenuation in the inferior tip of the right liver. This is may be a focus of subcapsular fat deposition. MRI of the abdomen with and without contrast recommended to further evaluate. 2. 9 mm nodule in the midline omentum. Potentially related to surgery, continued close attention on follow-up recommended. 3. Large stool volume. Imaging features compatible with clinical constipation.      CURRENT THERAPY: Surveillance   INTERVAL HISTORY Mr. Rowe returns for follow up as scheduled. Last seen by me 04/22/22.   ROS   Past Medical History:  Diagnosis Date   Colon cancer (HCC)    Diabetes mellitus without complication (HCC)    Pre-diabetes      Past Surgical History:  Procedure Laterality Date   COLON SURGERY  10/24/20   HERNIA  REPAIR     PROCTOSCOPY N/A 10/24/2020   Procedure: RIGID PROCTOSCOPY;  Surgeon: Karie Soda, MD;  Location: [facility];  Service: General;  Laterality: N/A;     Outpatient Encounter Medications as of 08/23/2022  Medication Sig   ergocalciferol (VITAMIN D2) 1.25 MG (50000 UT) capsule Take 1 capsule (50,000 Units total) by mouth once a week.   metFORMIN (GLUCOPHAGE) 500 MG tablet Take 500 mg by mouth daily.   No facility-administered encounter medications on file as of 08/23/2022.     There were no vitals filed for this visit. There is no height or weight on file to calculate BMI.   PHYSICAL EXAM GENERAL:alert, no distress and comfortable SKIN: no rash  EYES: sclera clear NECK: without mass LYMPH:  no palpable cervical or supraclavicular lymphadenopathy  LUNGS: clear with normal breathing effort HEART: regular rate & rhythm, no lower extremity edema ABDOMEN: abdomen soft, non-tender and normal bowel sounds NEURO: alert & oriented x 3 with fluent speech, no focal motor/sensory deficits Breast exam:  PAC without erythema    CBC    Component Value Date/Time   WBC 5.3 04/22/2022 0914   RBC 6.54 (H) 04/22/2022 0914   HGB 13.6 04/22/2022 0914   HGB 14.1 12/22/2021 1544   HCT 43.6 04/22/2022 0914   PLT 218 04/22/2022 0914   PLT 214 12/22/2021 1544   MCV 66.7 (L) 04/22/2022 0914   MCH 20.8 (L) 04/22/2022 0914   MCHC 31.2 04/22/2022 0914   RDW  14.4 04/22/2022 0914   LYMPHSABS 1.8 04/22/2022 0914   MONOABS 0.3 04/22/2022 0914   EOSABS 0.0 04/22/2022 0914   BASOSABS 0.0 04/22/2022 0914     CMP     Component Value Date/Time   NA 140 04/22/2022 0914   K 4.2 04/22/2022 0914   CL 105 04/22/2022 0914   CO2 28 04/22/2022 0914   GLUCOSE 115 (H) 04/22/2022 0914   BUN 10 04/22/2022 0914   CREATININE 0.89 04/22/2022 0914   CREATININE 0.88 12/22/2021 1544   CALCIUM 10.2 04/22/2022 0914   PROT 7.7 04/22/2022 0914   ALBUMIN 4.7 04/22/2022 0914   AST 23 04/22/2022 0914   AST 33  12/22/2021 1544   ALT 26 04/22/2022 0914   ALT 48 (H) 12/22/2021 1544   ALKPHOS 58 04/22/2022 0914   BILITOT 0.4 04/22/2022 0914   BILITOT 0.3 12/22/2021 1544   GFRNONAA >60 04/22/2022 0914   GFRNONAA >60 12/22/2021 1544     ASSESSMENT & PLAN:Kaveh Brant is a 47 y.o. male with    1. Cancer of rectosigmoid colon, pT2N1bM0, stage IIIA, MMR intact  -Diagnosed 09/25/20 (Dr. Elnoria Howard), S/p resection 10/24/20 (Dr. Michaell Cowing) and 3 months adjuvant CAPOX 11/21/20-01/23/2021.  He tolerated well. -Surveillance colonoscopy 10/2021 by Dr. Elnoria Howard was unremarkable, no samples were taken; 3 year recall  -Last surveillance scan 12/2021 NED, repeat 12/2022    2. Genetics -given his young age, he underwent testing 11/18/2020, results were negative -We discussed his children should begin colon cancer screening by age 86, or sooner if they develop red flags -Patient has microcytosis without anemia, he is a sickle cell carrier.  - His oldest son is affected with sickle cell disease and underwent transplant, donor was his youngest child.  The middle child is unaffected -I encouraged him to continue healthy active lifestyle     PLAN:  No orders of the defined types were placed in this encounter.     All questions were answered. The patient knows to call the clinic with any problems, questions or concerns. No barriers to learning were detected. I spent *** counseling the patient face to face. The total time spent in the appointment was *** and more than 50% was on counseling, review of test results, and coordination of care.   Santiago Glad, NP-C @DATE @

## 2022-08-23 ENCOUNTER — Inpatient Hospital Stay: Payer: BC Managed Care – PPO | Attending: Nurse Practitioner

## 2022-08-23 ENCOUNTER — Inpatient Hospital Stay: Payer: BC Managed Care – PPO | Admitting: Nurse Practitioner

## 2022-08-23 ENCOUNTER — Encounter: Payer: Self-pay | Admitting: Nurse Practitioner

## 2022-08-23 ENCOUNTER — Other Ambulatory Visit: Payer: Self-pay

## 2022-08-23 VITALS — BP 128/70 | HR 66 | Temp 98.2°F | Resp 19 | Wt 198.9 lb

## 2022-08-23 DIAGNOSIS — C19 Malignant neoplasm of rectosigmoid junction: Secondary | ICD-10-CM | POA: Diagnosis not present

## 2022-08-23 DIAGNOSIS — Z9221 Personal history of antineoplastic chemotherapy: Secondary | ICD-10-CM | POA: Insufficient documentation

## 2022-08-23 DIAGNOSIS — Z85038 Personal history of other malignant neoplasm of large intestine: Secondary | ICD-10-CM | POA: Diagnosis present

## 2022-08-23 LAB — CBC WITH DIFFERENTIAL/PLATELET
Abs Immature Granulocytes: 0.01 10*3/uL (ref 0.00–0.07)
Basophils Absolute: 0 10*3/uL (ref 0.0–0.1)
Basophils Relative: 0 %
Eosinophils Absolute: 0 10*3/uL (ref 0.0–0.5)
Eosinophils Relative: 0 %
HCT: 43.5 % (ref 39.0–52.0)
Hemoglobin: 13.6 g/dL (ref 13.0–17.0)
Immature Granulocytes: 0 %
Lymphocytes Relative: 42 %
Lymphs Abs: 2.1 10*3/uL (ref 0.7–4.0)
MCH: 20.8 pg — ABNORMAL LOW (ref 26.0–34.0)
MCHC: 31.3 g/dL (ref 30.0–36.0)
MCV: 66.6 fL — ABNORMAL LOW (ref 80.0–100.0)
Monocytes Absolute: 0.3 10*3/uL (ref 0.1–1.0)
Monocytes Relative: 6 %
Neutro Abs: 2.5 10*3/uL (ref 1.7–7.7)
Neutrophils Relative %: 52 %
Platelets: 211 10*3/uL (ref 150–400)
RBC: 6.53 MIL/uL — ABNORMAL HIGH (ref 4.22–5.81)
RDW: 15.6 % — ABNORMAL HIGH (ref 11.5–15.5)
WBC: 4.9 10*3/uL (ref 4.0–10.5)
nRBC: 0 % (ref 0.0–0.2)

## 2022-08-23 LAB — COMPREHENSIVE METABOLIC PANEL
ALT: 53 U/L — ABNORMAL HIGH (ref 0–44)
AST: 31 U/L (ref 15–41)
Albumin: 4.6 g/dL (ref 3.5–5.0)
Alkaline Phosphatase: 72 U/L (ref 38–126)
Anion gap: 6 (ref 5–15)
BUN: 10 mg/dL (ref 6–20)
CO2: 31 mmol/L (ref 22–32)
Calcium: 9.5 mg/dL (ref 8.9–10.3)
Chloride: 104 mmol/L (ref 98–111)
Creatinine, Ser: 0.9 mg/dL (ref 0.61–1.24)
GFR, Estimated: >60 mL/min (ref >60)
Glucose, Bld: 123 mg/dL — ABNORMAL HIGH (ref 70–99)
Potassium: 4.4 mmol/L (ref 3.5–5.1)
Sodium: 141 mmol/L (ref 135–145)
Total Bilirubin: 0.4 mg/dL (ref 0.3–1.2)
Total Protein: 7.4 g/dL (ref 6.5–8.1)

## 2022-08-23 LAB — CEA (IN HOUSE-CHCC): CEA (CHCC-In House): <1 ng/mL (ref 0.00–5.00)

## 2022-11-25 ENCOUNTER — Other Ambulatory Visit: Payer: Self-pay

## 2022-12-18 NOTE — Assessment & Plan Note (Signed)
pT2N1bM0, stage IIIA, MMR intact  -Diagnosed in 09/2020. S/p resection on 10/24/20 under Dr. Michaell Cowing, with path showing 3.2 cm invasive adenocarcinoma involving rectosigmoid colon invading into muscularis propria. Two lymph nodes were positive (2/28). Margins were negative. -he received 3 months of CAPOX, given q3weeks, 11/21/20 - 01/23/21. He tolerated well with mild cold sensitivity.  -surveillance CT AP on 12/2021 showed NED. -genetic testing negative in 11/2020 -continue cancer surveillance

## 2022-12-20 ENCOUNTER — Inpatient Hospital Stay: Payer: BC Managed Care – PPO

## 2022-12-20 ENCOUNTER — Inpatient Hospital Stay: Payer: BC Managed Care – PPO | Admitting: Hematology

## 2022-12-20 ENCOUNTER — Telehealth: Payer: Self-pay

## 2022-12-20 DIAGNOSIS — C19 Malignant neoplasm of rectosigmoid junction: Secondary | ICD-10-CM

## 2022-12-20 NOTE — Telephone Encounter (Signed)
Spoke with pt via telephone to inform pt that Dr. Mosetta Putt would like to reschedule pt's appts today until after he has completed the CT CAP w/contrast she ordered.  Stated once pt has completed the CT CAP, Dr. Mosetta Putt will see the pt in clinic to go over the CT Scan results.  Stated someone from our Central scheduling team will be in contact with the pt today to get him rescheduled.  Pt verbalized understanding and had no further questions or concerns.

## 2022-12-21 ENCOUNTER — Telehealth: Payer: Self-pay | Admitting: Hematology

## 2022-12-25 ENCOUNTER — Ambulatory Visit (HOSPITAL_COMMUNITY)
Admission: RE | Admit: 2022-12-25 | Discharge: 2022-12-25 | Disposition: A | Discharge status: Home / Self Care | Payer: BC Managed Care – PPO | Source: Ambulatory Visit | Attending: Nurse Practitioner

## 2022-12-25 DIAGNOSIS — R7303 Prediabetes: Secondary | ICD-10-CM | POA: Diagnosis present

## 2022-12-25 DIAGNOSIS — C19 Malignant neoplasm of rectosigmoid junction: Secondary | ICD-10-CM | POA: Diagnosis present

## 2022-12-25 LAB — POCT I-STAT CREATININE: Creatinine, Ser: 1 mg/dL (ref 0.61–1.24)

## 2022-12-25 MED ORDER — IOHEXOL 300 MG/ML  SOLN
30.0000 mL | Freq: Once | INTRAMUSCULAR | Status: AC | PRN
  Administered 2022-12-25: 30 mL via ORAL

## 2022-12-25 MED ORDER — IOHEXOL 300 MG/ML  SOLN
100.0000 mL | Freq: Once | INTRAMUSCULAR | Status: AC | PRN
Start: 2022-12-25 — End: 2022-12-25
  Administered 2022-12-25: 100 mL via INTRAVENOUS

## 2022-12-30 ENCOUNTER — Inpatient Hospital Stay: Payer: BC Managed Care – PPO | Attending: Nurse Practitioner | Admitting: Hematology

## 2022-12-30 ENCOUNTER — Inpatient Hospital Stay: Payer: BC Managed Care – PPO

## 2022-12-30 ENCOUNTER — Other Ambulatory Visit: Payer: Self-pay

## 2022-12-30 ENCOUNTER — Encounter: Payer: Self-pay | Admitting: Hematology

## 2022-12-30 VITALS — BP 144/95 | HR 65 | Temp 97.9°F | Wt 196.5 lb

## 2022-12-30 DIAGNOSIS — Z923 Personal history of irradiation: Secondary | ICD-10-CM | POA: Diagnosis not present

## 2022-12-30 DIAGNOSIS — K5900 Constipation, unspecified: Secondary | ICD-10-CM | POA: Insufficient documentation

## 2022-12-30 DIAGNOSIS — M25569 Pain in unspecified knee: Secondary | ICD-10-CM | POA: Diagnosis not present

## 2022-12-30 DIAGNOSIS — D573 Sickle-cell trait: Secondary | ICD-10-CM | POA: Diagnosis not present

## 2022-12-30 DIAGNOSIS — Z9049 Acquired absence of other specified parts of digestive tract: Secondary | ICD-10-CM | POA: Insufficient documentation

## 2022-12-30 DIAGNOSIS — Z85048 Personal history of other malignant neoplasm of rectum, rectosigmoid junction, and anus: Secondary | ICD-10-CM | POA: Insufficient documentation

## 2022-12-30 DIAGNOSIS — C19 Malignant neoplasm of rectosigmoid junction: Secondary | ICD-10-CM | POA: Diagnosis not present

## 2022-12-30 LAB — CMP (CANCER CENTER ONLY)
ALT: 15 U/L (ref 0–44)
AST: 17 U/L (ref 15–41)
Albumin: 4.8 g/dL (ref 3.5–5.0)
Alkaline Phosphatase: 57 U/L (ref 38–126)
Anion gap: 5 (ref 5–15)
BUN: 11 mg/dL (ref 6–20)
CO2: 30 mmol/L (ref 22–32)
Calcium: 9.9 mg/dL (ref 8.9–10.3)
Chloride: 105 mmol/L (ref 98–111)
Creatinine: 0.95 mg/dL (ref 0.61–1.24)
GFR, Estimated: >60 mL/min (ref >60)
Glucose, Bld: 98 mg/dL (ref 70–99)
Potassium: 4.2 mmol/L (ref 3.5–5.1)
Sodium: 140 mmol/L (ref 135–145)
Total Bilirubin: 0.4 mg/dL (ref <1.2)
Total Protein: 7.6 g/dL (ref 6.5–8.1)

## 2022-12-30 LAB — CBC WITH DIFFERENTIAL (CANCER CENTER ONLY)
Abs Immature Granulocytes: 0.01 10*3/uL (ref 0.00–0.07)
Basophils Absolute: 0 10*3/uL (ref 0.0–0.1)
Basophils Relative: 0 %
Eosinophils Absolute: 0 10*3/uL (ref 0.0–0.5)
Eosinophils Relative: 0 %
HCT: 45.1 % (ref 39.0–52.0)
Hemoglobin: 14 g/dL (ref 13.0–17.0)
Immature Granulocytes: 0 %
Lymphocytes Relative: 39 %
Lymphs Abs: 2 10*3/uL (ref 0.7–4.0)
MCH: 20.9 pg — ABNORMAL LOW (ref 26.0–34.0)
MCHC: 31 g/dL (ref 30.0–36.0)
MCV: 67.3 fL — ABNORMAL LOW (ref 80.0–100.0)
Monocytes Absolute: 0.3 10*3/uL (ref 0.1–1.0)
Monocytes Relative: 6 %
Neutro Abs: 2.8 10*3/uL (ref 1.7–7.7)
Neutrophils Relative %: 55 %
Platelet Count: 225 10*3/uL (ref 150–400)
RBC: 6.7 MIL/uL — ABNORMAL HIGH (ref 4.22–5.81)
RDW: 15.2 % (ref 11.5–15.5)
WBC Count: 5.1 10*3/uL (ref 4.0–10.5)
nRBC: 0 % (ref 0.0–0.2)

## 2022-12-30 LAB — CEA (ACCESS): CEA (CHCC): 1.09 ng/mL (ref 0.00–5.00)

## 2022-12-30 NOTE — Assessment & Plan Note (Signed)
pT2N1bM0, stage IIIA, MMR intact  -Diagnosed in 09/2020. S/p resection on 10/24/20 under Dr. Michaell Cowing, with path showing 3.2 cm invasive adenocarcinoma involving rectosigmoid colon invading into muscularis propria. Two lymph nodes were positive (2/28). Margins were negative. -he received 3 months of CAPOX, given q3weeks, 11/21/20 - 01/23/21. He tolerated well with mild cold sensitivity.  -surveillance CT AP on 12/25/2022 showed NED. -genetic testing negative in 11/2020 -continue cancer surveillance

## 2022-12-30 NOTE — Progress Notes (Signed)
[facility]   Telephone:([phone] Fax:(336) [phone]   Clinic Follow up Note   Patient Care Team: Rometta Emery, MD as PCP - General (Internal Medicine) Karie Soda, MD as Consulting Physician (Colon and Rectal Surgery) Jeani Hawking, MD as Consulting Physician (Gastroenterology) Malachy Mood, MD as Consulting Physician (Oncology)  Date of Service:  12/30/2022  CHIEF COMPLAINT: f/u of colon cancer  CURRENT THERAPY:  Cancer surveillance  Oncology History   Cancer of rectosigmoid (colon) (HCC) pT2N1bM0, stage IIIA, MMR intact  -Diagnosed in 09/2020. S/p resection on 10/24/20 under Dr. Michaell Cowing, with path showing 3.2 cm invasive adenocarcinoma involving rectosigmoid colon invading into muscularis propria. Two lymph nodes were positive (2/28). Margins were negative. -he received 3 months of CAPOX, given q3weeks, 11/21/20 - 01/23/21. He tolerated well with mild cold sensitivity.  -surveillance CT AP on 12/25/2022 showed NED. -genetic testing negative in 11/2020 -continue cancer surveillance    Assessment and Plan    Colon Cancer Follow-Up 47 year old with colon cancer diagnosed in September 2022. Recent CT scan shows no recurrence. Asymptomatic with normal blood tests. Sickle cell trait present. Critical monitoring period is the first three years post-diagnosis, with risk decreasing significantly after three years. Surveillance planned for five years. Discussed monitoring for symptoms such as abdominal pain, bloating, nausea, weight loss, and changes in bowel habits. Next CT scan in one year may be the last routine scan if no recurrence is detected. - Schedule follow-up visit in six months - Monitor for symptoms such as abdominal pain, bloating, nausea, weight loss, and changes in bowel habits  Constipation Regular use of Miralax. CT scan shows significant stool in the colon. No current constipation symptoms. Advised to increase water intake and consider stool softener  if needed. - Continue Miralax as needed - Advise increased water intake - Consider stool softener if needed  Knee Pain Intermittent pain below the knee, exacerbated by walking and bending. No injury history. Differential includes arthritis or post-exertional pain. Symptoms improving with reduced knee stress. - Advise to avoid stressing the knee - Monitor symptoms and consider further evaluation if pain persists or worsens  Plan -Lab and CT scan reviewed, NED - Schedule follow-up visit in six months -Will order CT scan on next visit to be done in one year.         SUMMARY OF ONCOLOGIC HISTORY: Oncology History  Cancer of rectosigmoid (colon) (HCC)  09/25/2020 Procedure   Colonoscopy, Dr. Elnoria Howard  Findings: -An infiltrative and ulcerated non-obstructing large mass was found in the sigmoid colon. The mass was non-circumferential. The mass measured 3 cm in length. Oozing was present. This was biopsied with a cold snare for histology. Areas was tattooed.  -Approximately 17 cm from the anal verge a large mass was identified. There was a central ulceration and large rolled edges. The lesion was nonobstructing and it was less than 50% of the circumference. Multiple cold snare biopsies were obtained. The lesion was tattooed proximally and distally.   09/25/2020 Imaging   CT CAP  IMPRESSION: No definite abnormality is noted in the chest.  7 mm right common iliac lymph node is noted concerning for metastatic disease given the history of newly diagnosed colonic malignancy.  No other abnormality seen in the abdomen or pelvis.   09/25/2020 Pathology Results   FINAL DIAGNOSIS:  A. Colon, sigmoid, mass, biopsy  - INVASIVE COLONIC ADENOCARCINOMA, MODERATELY-DIFFERENTIATED.  Addendum: A. Colon, sigmoid, mass, biopsy  - IMMUNOHISTOCHEMICAL STAINS FOR MLH1, MSH2, MSH6, AND PMS2 ARE INTACT (NORMAL)  10/24/2020 Initial Diagnosis   Cancer of rectosigmoid (colon) (HCC)   10/24/2020 Cancer Staging    Staging form: Colon and Rectum, AJCC 8th Edition - Pathologic stage from 10/24/2020: Stage IIIA (pT2, pN1b, cM0) - Signed by Malachy Mood, MD on 11/14/2020 Histologic grading system: 4 grade system Histologic grade (G): G2 Residual tumor (R): R0 - None   10/24/2020 Definitive Surgery   -XI ROBOTIC LOW ANTERIOR RESECTION , OMENTAL PEDICLE FLAP WITH OMENTOPEXY, PERFUSION ASSESSMENT USING FIREFLY, TAP BLOCK (Abdomen)  -RIGID PROCTOSCOPY (Rectum)   10/24/2020 Pathology Results   FINAL MICROSCOPIC DIAGNOSIS:   A. RECTOSIGMOID COLON, RESECTION:  - Invasive moderately differentiated adenocarcinoma, 3.2 cm, involving rectosigmoid colon  - Carcinoma invades into muscularis propria  - Metastatic carcinoma to two of twenty-eight lymph nodes (2/28)  - Resection margins are negative for carcinoma  - See oncology table   B. FINAL DISTAL MARGIN, EXCISION:  - Colonic donut, negative for carcinoma   C. PERIAORTIC LYMPH NODE, EXCISION:  - Benign fibroadipose tissue  - Lymphoid tissue is not identified    11/18/2020 Genetic Testing   [employer] Common Cancer Panel is Negative. Report date was 12/16/2020.  The Common Hereditary Cancers + RNA Panel offered by [facility] includes sequencing, deletion/duplication, and RNA testing of the following 47 genes: APC, ATM, AXIN2, BARD1, BMPR1A, BRCA1, BRCA2, BRIP1, CDH1, CDK4*, CDKN2A (p14ARF)*, CDKN2A (p16INK4a)*, CHEK2, CTNNA1, DICER1, EPCAM (Deletion/duplication testing only), GREM1 (promoter region deletion/duplication testing only), KIT, MEN1, MLH1, MSH2, MSH3, MSH6, MUTYH, NBN, NF1, NHTL1, PALB2, PDGFRA*, PMS2, POLD1, POLE, PTEN, RAD50, RAD51C, RAD51D, SDHB, SDHC, SDHD, SMAD4, SMARCA4. STK11, TP53, TSC1, TSC2, and VHL.  The following genes were evaluated for sequence changes only: SDHA and HOXB13 c.251G>A variant only.  RNA analysis is not performed for the * genes.     03/19/2021 Imaging   EXAM: CT CHEST, ABDOMEN, AND PELVIS WITH CONTRAST  IMPRESSION: 1. 12 mm  subtle focus of hypo attenuation in the inferior tip of the right liver. This is may be a focus of subcapsular fat deposition. MRI of the abdomen with and without contrast recommended to further evaluate. 2. 9 mm nodule in the midline omentum. Potentially related to surgery, continued close attention on follow-up recommended. 3. Large stool volume. Imaging features compatible with clinical constipation.   06/19/2021 Imaging   CT AP w contrast (3 mo f/up to 03/2021 CT) IMPRESSION: 1. Status post partial sigmoidectomy without evidence of local recurrence. 2. No convincing evidence of metastatic disease in the abdomen or pelvis. 3. Decreased size of the small omental nodule, favored postsurgical, continued attention on follow-up imaging suggested. 4. Previously identified subtle hepatic lesion is not seen on today's examination and favored benign.   12/22/2021 Imaging    IMPRESSION: 1. Surgical changes of partial sigmoidectomy with rectosigmoid anastomosis common no evidence of local recurrence. 2. No evidence of new or progressive disease in the chest, abdomen, or pelvis. 3. Interval resolution of the tiny omental nodule seen on prior studies.   Rectosigmoid cancer (HCC)  11/14/2020 Initial Diagnosis   Rectosigmoid cancer (HCC)   11/21/2020 - 01/23/2021 Chemotherapy   Patient is on Treatment Plan : COLORECTAL Xelox (Capeox) q21d     03/19/2021 Imaging   EXAM: CT CHEST, ABDOMEN, AND PELVIS WITH CONTRAST  IMPRESSION: 1. 12 mm subtle focus of hypo attenuation in the inferior tip of the right liver. This is may be a focus of subcapsular fat deposition. MRI of the abdomen with and without contrast recommended to further evaluate. 2. 9 mm nodule in  the midline omentum. Potentially related to surgery, continued close attention on follow-up recommended. 3. Large stool volume. Imaging features compatible with clinical constipation.      Discussed the use of AI scribe software for  clinical note transcription with the patient, who gave verbal consent to proceed.  History of Present Illness   A 47 year old patient with a history of colon cancer presents for a routine follow-up. The patient reports experiencing pain below the knee on the outside, the cause of which is unknown. The pain does not prevent walking but is noticeable upon bending the leg, particularly after walking. The patient denies any injury that could have led to this pain. The pain appears to be improving, provided the leg is not overly stressed.  In terms of gastrointestinal health, the patient denies any stomach issues or changes in bowel movements. Regular bowel movements are reported, with no instances of diarrhea. The patient is taking Miralax daily to aid in bowel regulation.  The patient has a known history of sickle cell trait and is aware of the need for regular hydration. A recent CT scan has shown no signs of cancer recurrence, with only surgical changes and stool presence noted.         All other systems were reviewed with the patient and are negative.  MEDICAL HISTORY:  Past Medical History:  Diagnosis Date   Colon cancer (HCC)    Diabetes mellitus without complication (HCC)    Pre-diabetes     SURGICAL HISTORY: Past Surgical History:  Procedure Laterality Date   COLON SURGERY  10/24/20   HERNIA REPAIR     PROCTOSCOPY N/A 10/24/2020   Procedure: RIGID PROCTOSCOPY;  Surgeon: Karie Soda, MD;  Location: [facility];  Service: General;  Laterality: N/A;    I have reviewed the social history and family history with the patient and they are unchanged from previous note.  ALLERGIES:  has no known allergies.  MEDICATIONS:  Current Outpatient Medications  Medication Sig Dispense Refill   metFORMIN (GLUCOPHAGE) 500 MG tablet Take 500 mg by mouth daily.     No current facility-administered medications for this visit.    PHYSICAL EXAMINATION: ECOG PERFORMANCE STATUS: 0 -  Asymptomatic  Vitals:   12/30/22 0957  BP: (!) 144/95  Pulse: 65  Temp: 97.9 F (36.6 C)  SpO2: 100%   Wt Readings from Last 3 Encounters:  12/30/22 196 lb 8 oz (89.1 kg)  08/23/22 198 lb 14.4 oz (90.2 kg)  04/22/22 200 lb 8 oz (90.9 kg)     GENERAL:alert, no distress and comfortable SKIN: skin color, texture, turgor are normal, no rashes or significant lesions EYES: normal, Conjunctiva are pink and non-injected, sclera clear NECK: supple, thyroid normal size, non-tender, without nodularity LYMPH:  no palpable lymphadenopathy in the cervical, axillary  LUNGS: clear to auscultation and percussion with normal breathing effort HEART: regular rate & rhythm and no murmurs and no lower extremity edema ABDOMEN:abdomen soft, non-tender and normal bowel sounds Musculoskeletal:no cyanosis of digits and no clubbing  NEURO: alert & oriented x 3 with fluent speech, no focal motor/sensory deficits   LABORATORY DATA:  I have reviewed the data as listed    Latest Ref Rng & Units 12/30/2022    9:36 AM 08/23/2022    9:06 AM 04/22/2022    9:14 AM  CBC  WBC 4.0 - 10.5 K/uL 5.1  4.9  5.3   Hemoglobin 13.0 - 17.0 g/dL 16.1  09.6  04.5   Hematocrit 39.0 - 52.0 %  45.1  43.5  43.6   Platelets 150 - 400 K/uL 225  211  218         Latest Ref Rng & Units 12/30/2022    9:36 AM 12/25/2022   10:27 AM 08/23/2022    9:06 AM  CMP  Glucose 70 - 99 mg/dL 98   259   BUN 6 - 20 mg/dL 11   10   Creatinine 5.63 - 1.24 mg/dL 8.75  6.43  3.29   Sodium 135 - 145 mmol/L 140   141   Potassium 3.5 - 5.1 mmol/L 4.2   4.4   Chloride 98 - 111 mmol/L 105   104   CO2 22 - 32 mmol/L 30   31   Calcium 8.9 - 10.3 mg/dL 9.9   9.5   Total Protein 6.5 - 8.1 g/dL 7.6   7.4   Total Bilirubin <1.2 mg/dL 0.4   0.4   Alkaline Phos 38 - 126 U/L 57   72   AST 15 - 41 U/L 17   31   ALT 0 - 44 U/L 15   53       RADIOGRAPHIC STUDIES: I have personally reviewed the radiological images as listed and agreed with the findings  in the report. No results found.    No orders of the defined types were placed in this encounter.  All questions were answered. The patient knows to call the clinic with any problems, questions or concerns. No barriers to learning was detected. The total time spent in the appointment was 25 minutes.     Malachy Mood, MD 12/30/2022

## 2023-01-06 LAB — MOLECULAR PATHOLOGY

## 2023-05-02 IMAGING — CT CT ABD-PELV W/ CM
2 of 5 series · 16 of 46 positions shown, 18 images · IV contrast (OMNIPAQUE 300)
Comparison: Multiple priors including most recent CT March 19, 2021

CLINICAL DATA: History of colon cancer status post surgical
resection. Follow-up. * Tracking Code: BO *.

EXAM:
CT ABDOMEN AND PELVIS WITH CONTRAST
TECHNIQUE: Multidetector CT imaging of the abdomen and pelvis was performed
using the standard protocol following bolus administration of
intravenous contrast.

[Series 2: axial st · axial · 0.82mm/px · z∈[-478,-58]mm · 13 of 98 slices shown, 15 images]
[im 7/98  soft-tissue]
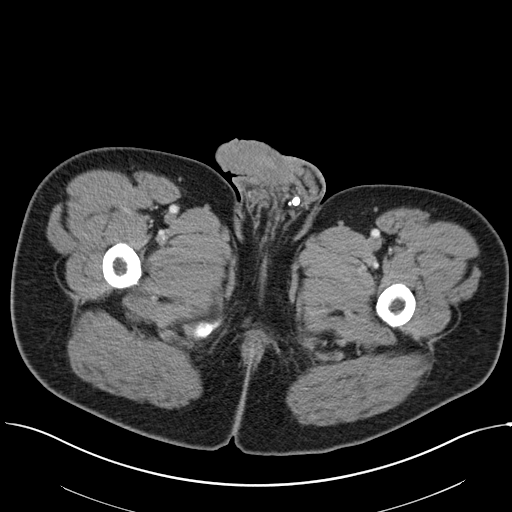
[im 7/98  bone]
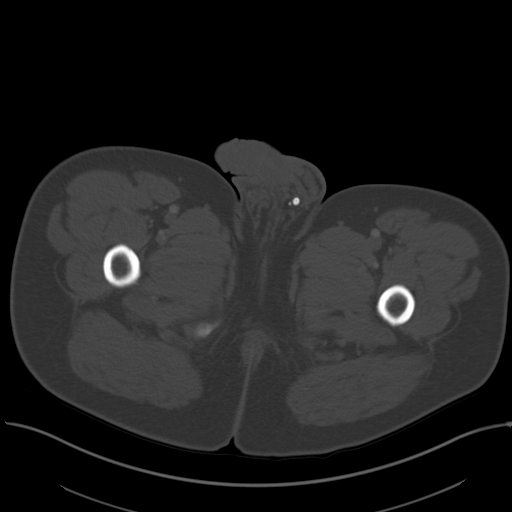
[im 14/98  soft-tissue]
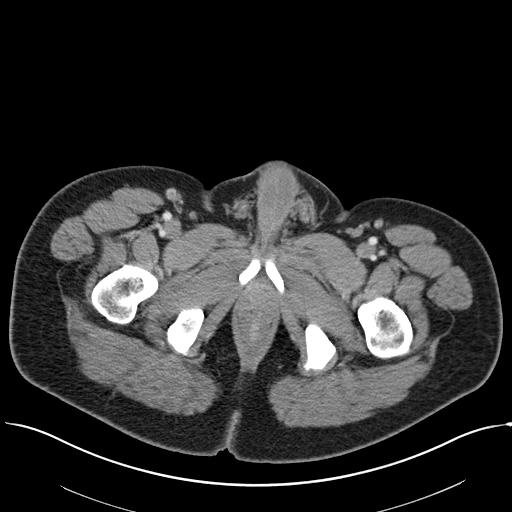
[im 21/98  soft-tissue]
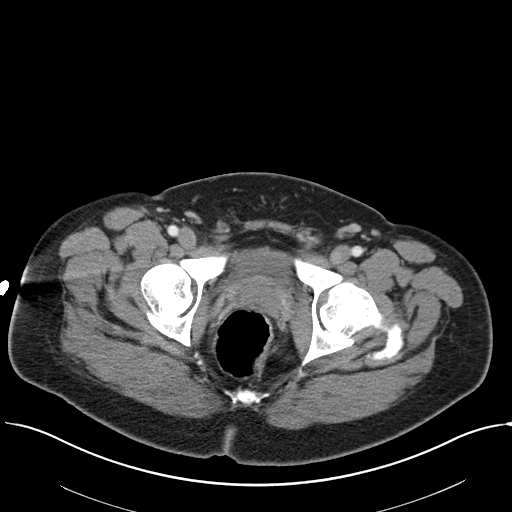
[im 28/98  soft-tissue]
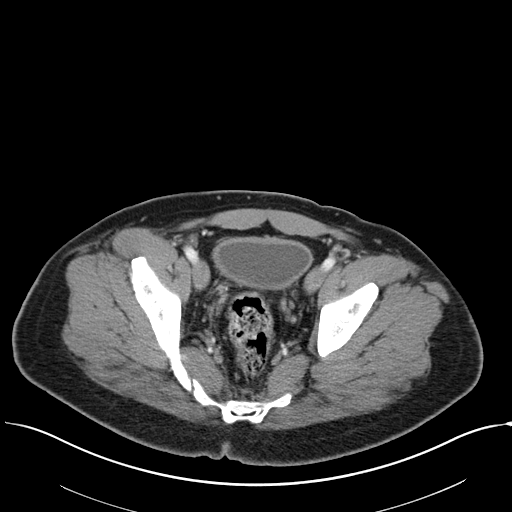
[im 35/98  soft-tissue]
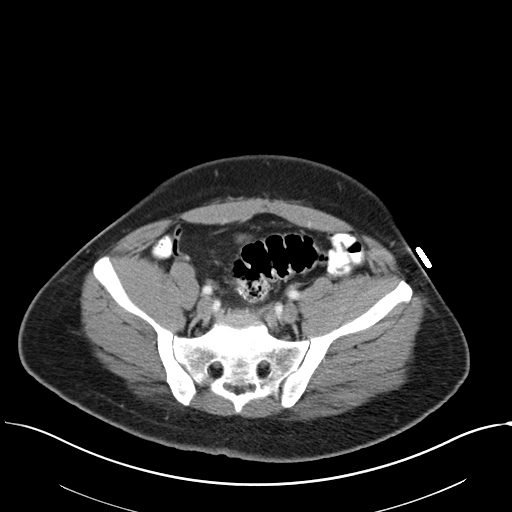
[im 42/98  soft-tissue]
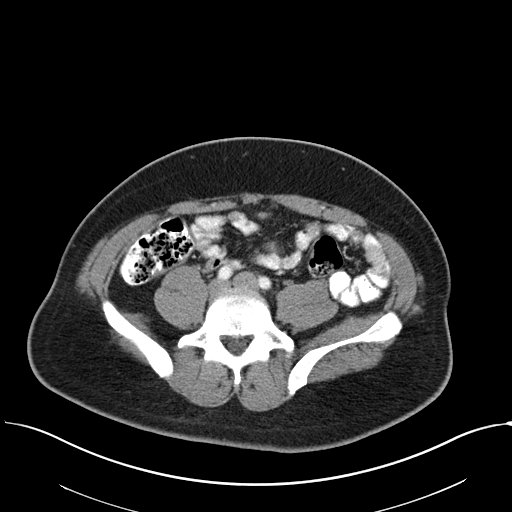
[im 49/98  soft-tissue]
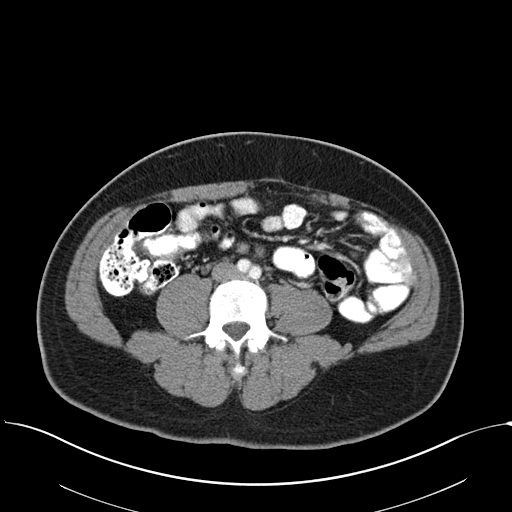
[im 56/98  soft-tissue]
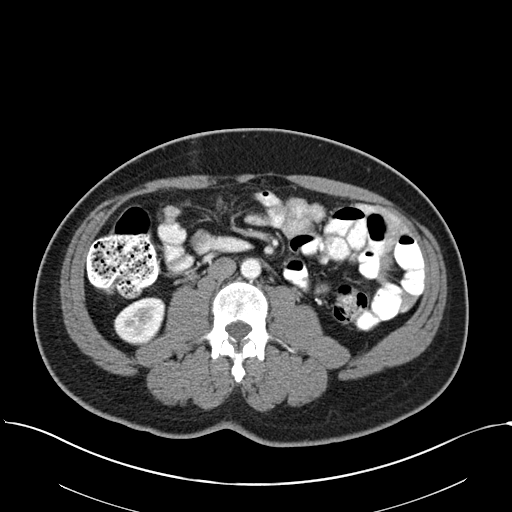
[im 63/98  soft-tissue]
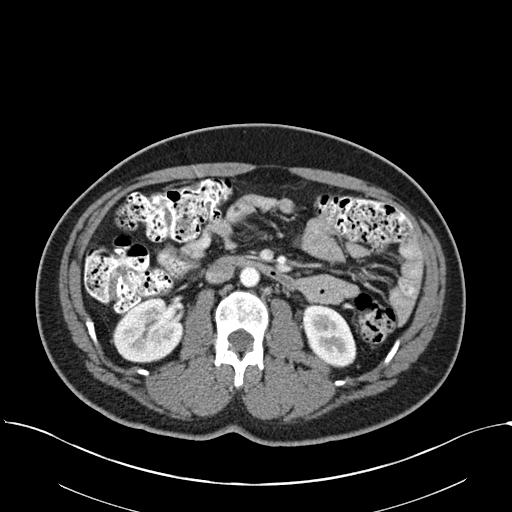
[im 63/98  bone]
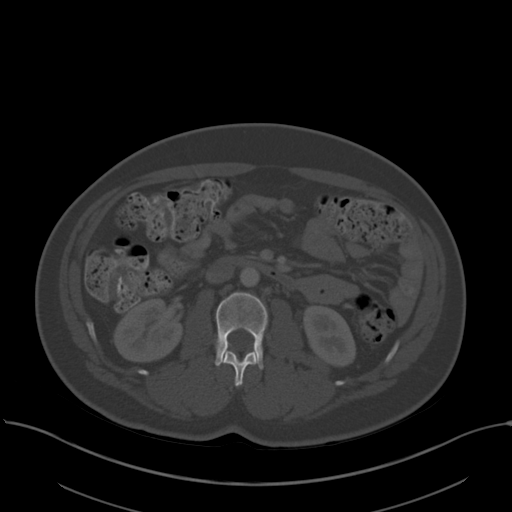
[im 70/98  soft-tissue]
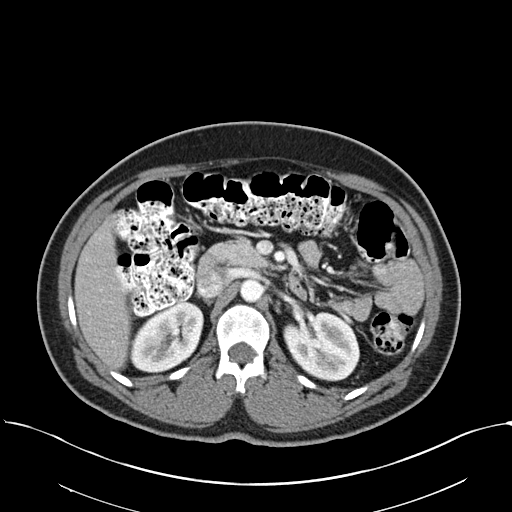
[im 77/98  soft-tissue]
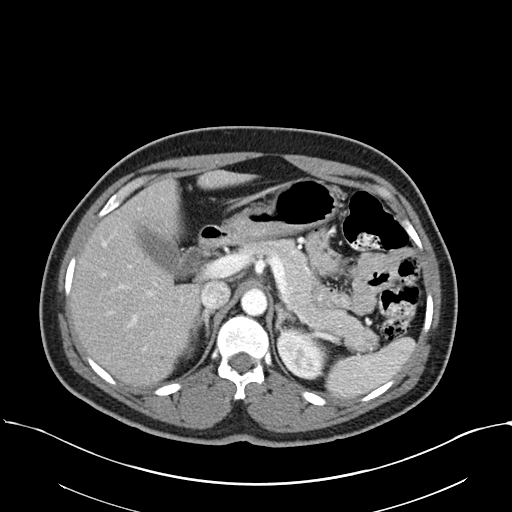
[im 84/98  soft-tissue]
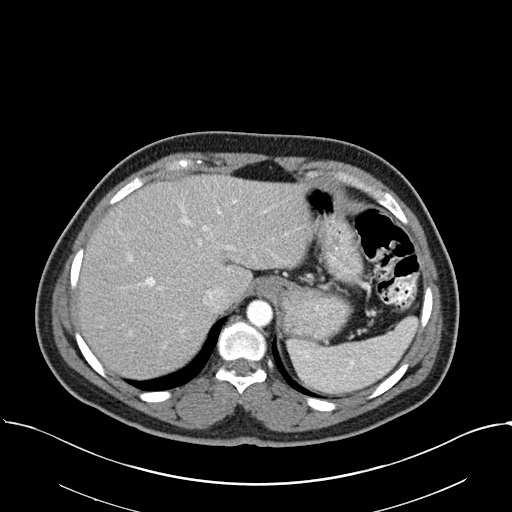
[im 91/98  soft-tissue]
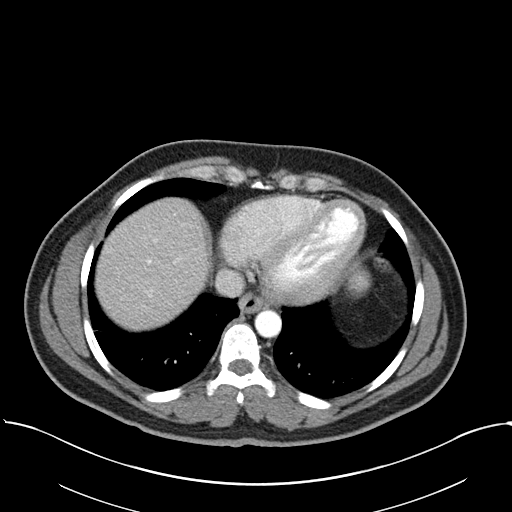

[Series 4: coronal st · coronal · 0.88mm/px · 3 of 141 slices shown]
[im 47/141  soft-tissue]
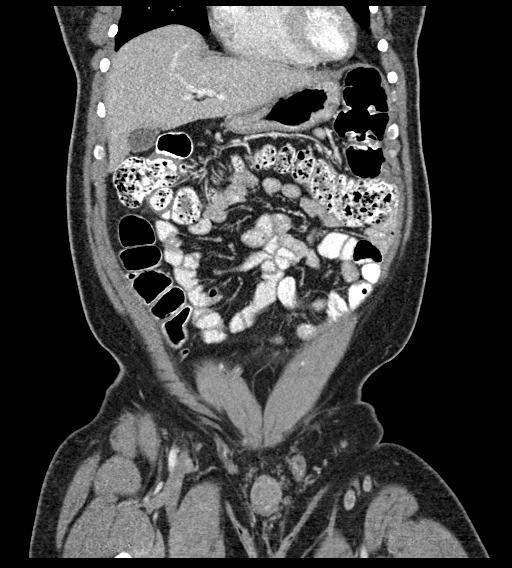
[im 63/141  soft-tissue]
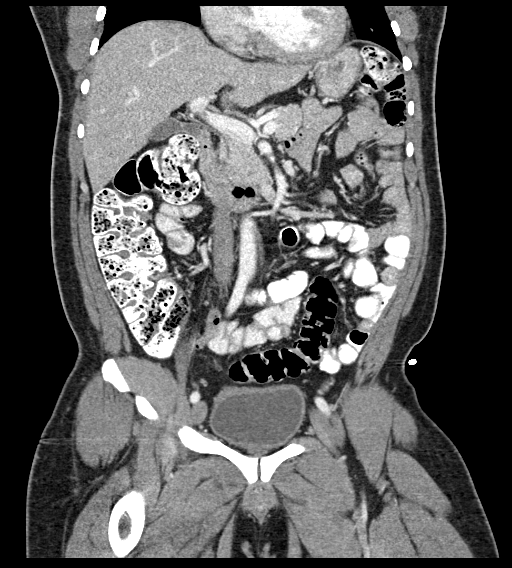
[im 78/141  soft-tissue]
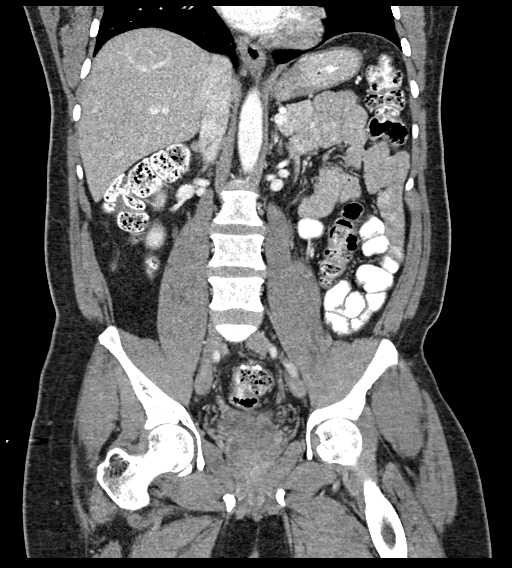

[16 of 46 positions shown; findings below may reference images not displayed]

RADIATION DOSE REDUCTION: This exam was performed according to the
departmental dose-optimization program which includes automated
exposure control, adjustment of the mA and/or kV according to
patient size and/or use of iterative reconstruction technique.

CONTRAST:  100mL OMNIPAQUE IOHEXOL 300 MG/ML  SOLN
FINDINGS: Lower chest: No acute abnormality.

Hepatobiliary: Subtle hypodense focus seen in the inferior right
lobe of the liver on prior examination is not seen on today's study.
No suspicious hepatic lesion. Gallbladder is unremarkable. No
biliary ductal dilation.

Pancreas: No pancreatic ductal dilation or evidence of acute
inflammation.

Spleen: No splenomegaly or focal splenic lesion.

Adrenals/Urinary Tract: Bilateral adrenal glands appear normal. No
hydronephrosis. Kidneys demonstrate symmetric enhancement and
excretion of contrast material. Urinary bladder is unremarkable for
degree of distension.

Stomach/Bowel: Radiopaque enteric contrast material traverses the
sigmoid colon. Prior partial sigmoidectomy with colonic anastomosis
in the low midline pelvis. No suspicious nodularity along the
anastomotic sutures. No gross colonic mass. No pathologic dilation
or evidence of acute inflammation involving loops of large or small
bowel. Moderate volume of formed stool throughout the colon.

Vascular/Lymphatic: Normal caliber abdominal aorta. No
pathologically enlarged abdominal or pelvic lymph nodes.

Reproductive: Prostate is unremarkable.

Other: Decreased size of the small nodule in the midline omentum now
measuring 7 mm on image 57/2 previously 9 mm.

Musculoskeletal: No aggressive lytic or blastic lesion of bone.
IMPRESSION: 1. Status post partial sigmoidectomy without evidence of local
recurrence.
2. No convincing evidence of metastatic disease in the abdomen or
pelvis.
3. Decreased size of the small omental nodule, favored postsurgical,
continued attention on follow-up imaging suggested.
4. Previously identified subtle hepatic lesion is not seen on
today's examination and favored benign.

## 2023-06-27 ENCOUNTER — Other Ambulatory Visit: Payer: Self-pay

## 2023-06-27 DIAGNOSIS — C19 Malignant neoplasm of rectosigmoid junction: Secondary | ICD-10-CM

## 2023-06-27 NOTE — Progress Notes (Unsigned)
 [facility]   Telephone:([phone] Fax:([phone]) [phone]    Patient Care Team: Davida Espy, MD as PCP - General (Internal Medicine) Candyce Champagne, MD as Consulting Physician (Colon and Rectal Surgery) Alvis Jourdain, MD as Consulting Physician (Gastroenterology) Sonja [place], MD as Consulting Physician (Oncology)   CHIEF COMPLAINT: Follow up colon cancer   Oncology History  Cancer of rectosigmoid (colon) (HCC)  09/25/2020 Procedure   Colonoscopy, Dr. Nickey Barn  Findings: -An infiltrative and ulcerated non-obstructing large mass was found in the sigmoid colon. The mass was non-circumferential. The mass measured 3 cm in length. Oozing was present. This was biopsied with a cold snare for histology. Areas was tattooed.  -Approximately 17 cm from the anal verge a large mass was identified. There was a central ulceration and large rolled edges. The lesion was nonobstructing and it was less than 50% of the circumference. Multiple cold snare biopsies were obtained. The lesion was tattooed proximally and distally.   09/25/2020 Imaging   CT CAP  IMPRESSION: No definite abnormality is noted in the chest.  7 mm right common iliac lymph node is noted concerning for metastatic disease given the history of newly diagnosed colonic malignancy.  No other abnormality seen in the abdomen or pelvis.   09/25/2020 Pathology Results   FINAL DIAGNOSIS:  A. Colon, sigmoid, mass, biopsy  - INVASIVE COLONIC ADENOCARCINOMA, MODERATELY-DIFFERENTIATED.  Addendum: A. Colon, sigmoid, mass, biopsy  - IMMUNOHISTOCHEMICAL STAINS FOR MLH1, MSH2, MSH6, AND PMS2 ARE INTACT (NORMAL)   10/24/2020 Initial Diagnosis   Cancer of rectosigmoid (colon) (HCC)   10/24/2020 Cancer Staging   Staging form: Colon and Rectum, AJCC 8th Edition - Pathologic stage from 10/24/2020: Stage IIIA (pT2, pN1b, cM0) - Signed by Sonja [place], MD on 11/14/2020 Histologic grading system: 4 grade system Histologic grade (G):  G2 Residual tumor (R): R0 - None   10/24/2020 Definitive Surgery   -XI ROBOTIC LOW ANTERIOR RESECTION , OMENTAL PEDICLE FLAP WITH OMENTOPEXY, PERFUSION ASSESSMENT USING FIREFLY, TAP BLOCK (Abdomen)  -RIGID PROCTOSCOPY (Rectum)   10/24/2020 Pathology Results   FINAL MICROSCOPIC DIAGNOSIS:   A. RECTOSIGMOID COLON, RESECTION:  - Invasive moderately differentiated adenocarcinoma, 3.2 cm, involving rectosigmoid colon  - Carcinoma invades into muscularis propria  - Metastatic carcinoma to two of twenty-eight lymph nodes (2/28)  - Resection margins are negative for carcinoma  - See oncology table   B. FINAL DISTAL MARGIN, EXCISION:  - Colonic donut, negative for carcinoma   C. PERIAORTIC LYMPH NODE, EXCISION:  - Benign fibroadipose tissue  - Lymphoid tissue is not identified    11/18/2020 Genetic Testing   [facility] Common Cancer Panel is Negative. Report date was 12/16/2020.  The Common Hereditary Cancers + RNA Panel offered by [facility] includes sequencing, deletion/duplication, and RNA testing of the following 47 genes: APC, ATM, AXIN2, BARD1, BMPR1A, BRCA1, BRCA2, BRIP1, CDH1, CDK4*, CDKN2A (p14ARF)*, CDKN2A (p16INK4a)*, CHEK2, CTNNA1, DICER1, EPCAM (Deletion/duplication testing only), GREM1 (promoter region deletion/duplication testing only), KIT, MEN1, MLH1, MSH2, MSH3, MSH6, MUTYH, NBN, NF1, NHTL1, PALB2, PDGFRA*, PMS2, POLD1, POLE, PTEN, RAD50, RAD51C, RAD51D, SDHB, SDHC, SDHD, SMAD4, SMARCA4. STK11, TP53, TSC1, TSC2, and VHL.  The following genes were evaluated for sequence changes only: SDHA and HOXB13 c.251G>A variant only.  RNA analysis is not performed for the * genes.     03/19/2021 Imaging   EXAM: CT CHEST, ABDOMEN, AND PELVIS WITH CONTRAST  IMPRESSION: 1. 12 mm subtle focus of hypo attenuation in the inferior tip of the right liver. This  is may be a focus of subcapsular fat deposition. MRI of the abdomen with and without contrast recommended to further evaluate. 2. 9 mm nodule in  the midline omentum. Potentially related to surgery, continued close attention on follow-up recommended. 3. Large stool volume. Imaging features compatible with clinical constipation.   06/19/2021 Imaging   CT AP w contrast (3 mo f/up to 03/2021 CT) IMPRESSION: 1. Status post partial sigmoidectomy without evidence of local recurrence. 2. No convincing evidence of metastatic disease in the abdomen or pelvis. 3. Decreased size of the small omental nodule, favored postsurgical, continued attention on follow-up imaging suggested. 4. Previously identified subtle hepatic lesion is not seen on today's examination and favored benign.   12/22/2021 Imaging    IMPRESSION: 1. Surgical changes of partial sigmoidectomy with rectosigmoid anastomosis common no evidence of local recurrence. 2. No evidence of new or progressive disease in the chest, abdomen, or pelvis. 3. Interval resolution of the tiny omental nodule seen on prior studies.   Rectosigmoid cancer (HCC)  11/14/2020 Initial Diagnosis   Rectosigmoid cancer (HCC)   11/21/2020 - 01/23/2021 Chemotherapy   Patient is on Treatment Plan : COLORECTAL Xelox (Capeox) q21d     03/19/2021 Imaging   EXAM: CT CHEST, ABDOMEN, AND PELVIS WITH CONTRAST  IMPRESSION: 1. 12 mm subtle focus of hypo attenuation in the inferior tip of the right liver. This is may be a focus of subcapsular fat deposition. MRI of the abdomen with and without contrast recommended to further evaluate. 2. 9 mm nodule in the midline omentum. Potentially related to surgery, continued close attention on follow-up recommended. 3. Large stool volume. Imaging features compatible with clinical constipation.      CURRENT THERAPY: Colon cancer surveillance   INTERVAL HISTORY Clifford [name] returns for follow up as scheduled. Last seen by Dr. Maryalice Smaller 12/30/22, doing "wonderful," without significant changes in his health.  Denies residual side effects from chemo.  Energy and appetite are  normal.  Denies change in bowel habits, rectal pain or bleeding, abdominal pain/bloating, or unintentional weight loss.  Sees PCP routinely.  ROS  All other systems reviewed and negative  Past Medical History:  Diagnosis Date   Colon cancer (HCC)    Diabetes mellitus without complication (HCC)    Pre-diabetes      Past Surgical History:  Procedure Laterality Date   COLON SURGERY  10/24/20   HERNIA REPAIR     PROCTOSCOPY N/A 10/24/2020   Procedure: RIGID PROCTOSCOPY;  Surgeon: Candyce Champagne, MD;  Location: [facility];  Service: General;  Laterality: N/A;     Outpatient Encounter Medications as of 06/28/2023  Medication Sig   metFORMIN  (GLUCOPHAGE ) 500 MG tablet Take 500 mg by mouth daily.   No facility-administered encounter medications on file as of 06/28/2023.     Today's Vitals   06/28/23 0955 06/28/23 0958  BP: 132/82   Pulse: 69   Resp: 16   Temp: 98 F (36.7 C)   TempSrc: Temporal   SpO2: 98%   Weight: 195 lb 1.6 oz (88.5 kg)   PainSc:  0-No pain   Body mass index is 31.49 kg/m.   ECOG PERFORMANCE STATUS: 0 - Asymptomatic  PHYSICAL EXAM GENERAL:alert, no distress and comfortable SKIN: no rash  EYES: sclera clear NECK: without mass LYMPH:  no palpable cervical or supraclavicular lymphadenopathy  LUNGS: clear with normal breathing effort HEART: regular rate & rhythm, no lower extremity edema ABDOMEN: abdomen soft, non-tender and normal bowel sounds NEURO: alert & oriented x 3 with  fluent speech, no focal motor/sensory deficits   CBC    Latest Ref Rng & Units 06/28/2023    9:42 AM 12/30/2022    9:36 AM 08/23/2022    9:06 AM  CBC  WBC 4.0 - 10.5 K/uL 4.6  5.1  4.9   Hemoglobin 13.0 - 17.0 g/dL 09.8  11.9  14.7   Hematocrit 39.0 - 52.0 % 42.3  45.1  43.5   Platelets 150 - 400 K/uL 201  225  211       CMP     Latest Ref Rng & Units 12/30/2022    9:36 AM 12/25/2022   10:27 AM 08/23/2022    9:06 AM  CMP  Glucose 70 - 99 mg/dL 98   829   BUN 6 - 20 mg/dL  11   10   Creatinine 5.62 - 1.24 mg/dL 1.30  8.65  7.84   Sodium 135 - 145 mmol/L 140   141   Potassium 3.5 - 5.1 mmol/L 4.2   4.4   Chloride 98 - 111 mmol/L 105   104   CO2 22 - 32 mmol/L 30   31   Calcium  8.9 - 10.3 mg/dL 9.9   9.5   Total Protein 6.5 - 8.1 g/dL 7.6   7.4   Total Bilirubin <1.2 mg/dL 0.4   0.4   Alkaline Phos 38 - 126 U/L 57   72   AST 15 - 41 U/L 17   31   ALT 0 - 44 U/L 15   53       ASSESSMENT & PLAN:Clifford [name] is a 48 y.o. male with    1. Cancer of rectosigmoid colon, pT2N1bM0, stage IIIA, MMR intact  -Diagnosed 09/25/20 (Dr. Nickey Barn), S/p resection 10/24/20 (Dr. Hershell Lose) and 3 months adjuvant CAPOX 11/21/20-01/23/2021.  He tolerated well. -Surveillance colonoscopy 10/2021 by Dr. Nickey Barn was unremarkable, no samples were taken; 3 year recall  -Last surveillance scan 12/25/22 NED - Clifford [name] is clinically doing well, exam is benign, labs are stable.  Overall no clinical concern for recurrence - He is approaching 3 years from definitive surgery, the recurrence risk will decrease.   - Continue surveillance, lab and CT scan in 6 months, then follow-up a week later.     2. 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  - His oldest son is affected with sickle cell disease and underwent transplant, donor was his youngest child.  The middle child is unaffected         PLAN: -Labs reviewed -Continue surveillance -Lab and CT in 6 months, f/up a week later (or sooner if needed) -Colonoscopy 10/2024  Orders Placed This Encounter  Procedures   CT ABDOMEN PELVIS W CONTRAST    Standing Status:   Future    Expected Date:   12/28/2023    Expiration Date:   06/27/2024    If indicated for the ordered procedure, I authorize the administration of contrast  media per Radiology protocol:   Yes    Does the patient have a contrast media/X-ray dye allergy?:   No    Preferred imaging location?:   [facility]    If indicated for the ordered procedure, I authorize the administration of oral contrast media per Radiology protocol:   Yes  All questions were answered. The patient knows to call the clinic with any problems, questions or concerns. No barriers to learning were detected.  Clifford Riebe K Oris Staffieri, NP 06/28/2023

## 2023-06-28 ENCOUNTER — Inpatient Hospital Stay: Payer: BC Managed Care – PPO

## 2023-06-28 ENCOUNTER — Encounter: Payer: Self-pay | Admitting: Nurse Practitioner

## 2023-06-28 ENCOUNTER — Inpatient Hospital Stay: Payer: BC Managed Care – PPO | Attending: Hematology | Admitting: Nurse Practitioner

## 2023-06-28 VITALS — BP 132/82 | HR 69 | Temp 98.0°F | Resp 16 | Wt 195.1 lb

## 2023-06-28 DIAGNOSIS — R718 Other abnormality of red blood cells: Secondary | ICD-10-CM | POA: Insufficient documentation

## 2023-06-28 DIAGNOSIS — Z9221 Personal history of antineoplastic chemotherapy: Secondary | ICD-10-CM | POA: Insufficient documentation

## 2023-06-28 DIAGNOSIS — C19 Malignant neoplasm of rectosigmoid junction: Secondary | ICD-10-CM

## 2023-06-28 DIAGNOSIS — Z85038 Personal history of other malignant neoplasm of large intestine: Secondary | ICD-10-CM | POA: Insufficient documentation

## 2023-06-28 LAB — CBC WITH DIFFERENTIAL (CANCER CENTER ONLY)
Abs Immature Granulocytes: 0.01 10*3/uL (ref 0.00–0.07)
Basophils Absolute: 0 10*3/uL (ref 0.0–0.1)
Basophils Relative: 0 %
Eosinophils Absolute: 0 10*3/uL (ref 0.0–0.5)
Eosinophils Relative: 0 %
HCT: 42.3 % (ref 39.0–52.0)
Hemoglobin: 13.5 g/dL (ref 13.0–17.0)
Immature Granulocytes: 0 %
Lymphocytes Relative: 37 %
Lymphs Abs: 1.7 10*3/uL (ref 0.7–4.0)
MCH: 20.9 pg — ABNORMAL LOW (ref 26.0–34.0)
MCHC: 31.9 g/dL (ref 30.0–36.0)
MCV: 65.4 fL — ABNORMAL LOW (ref 80.0–100.0)
Monocytes Absolute: 0.3 10*3/uL (ref 0.1–1.0)
Monocytes Relative: 7 %
Neutro Abs: 2.5 10*3/uL (ref 1.7–7.7)
Neutrophils Relative %: 56 %
Platelet Count: 201 10*3/uL (ref 150–400)
RBC: 6.47 MIL/uL — ABNORMAL HIGH (ref 4.22–5.81)
RDW: 15.3 % (ref 11.5–15.5)
WBC Count: 4.6 10*3/uL (ref 4.0–10.5)
nRBC: 0 % (ref 0.0–0.2)

## 2023-06-28 LAB — CEA (ACCESS): CEA (CHCC): 1.13 ng/mL (ref 0.00–5.00)

## 2023-06-28 LAB — CMP (CANCER CENTER ONLY)
ALT: 17 U/L (ref 0–44)
AST: 17 U/L (ref 15–41)
Albumin: 4.5 g/dL (ref 3.5–5.0)
Alkaline Phosphatase: 57 U/L (ref 38–126)
Anion gap: 4 — ABNORMAL LOW (ref 5–15)
BUN: 9 mg/dL (ref 6–20)
CO2: 29 mmol/L (ref 22–32)
Calcium: 9.5 mg/dL (ref 8.9–10.3)
Chloride: 107 mmol/L (ref 98–111)
Creatinine: 0.9 mg/dL (ref 0.61–1.24)
GFR, Estimated: >60 mL/min (ref >60)
Glucose, Bld: 112 mg/dL — ABNORMAL HIGH (ref 70–99)
Potassium: 4.1 mmol/L (ref 3.5–5.1)
Sodium: 140 mmol/L (ref 135–145)
Total Bilirubin: 0.3 mg/dL (ref 0.0–1.2)
Total Protein: 7.1 g/dL (ref 6.5–8.1)

## 2023-08-20 ENCOUNTER — Encounter: Payer: Self-pay | Admitting: Nurse Practitioner

## 2023-12-21 ENCOUNTER — Inpatient Hospital Stay: Attending: Hematology

## 2023-12-21 ENCOUNTER — Ambulatory Visit (HOSPITAL_COMMUNITY)
Admission: RE | Admit: 2023-12-21 | Discharge: 2023-12-21 | Disposition: A | Discharge status: Home / Self Care | Source: Ambulatory Visit | Attending: Nurse Practitioner | Admitting: Nurse Practitioner

## 2023-12-21 DIAGNOSIS — Z7984 Long term (current) use of oral hypoglycemic drugs: Secondary | ICD-10-CM | POA: Diagnosis not present

## 2023-12-21 DIAGNOSIS — C19 Malignant neoplasm of rectosigmoid junction: Secondary | ICD-10-CM | POA: Insufficient documentation

## 2023-12-21 DIAGNOSIS — Z9221 Personal history of antineoplastic chemotherapy: Secondary | ICD-10-CM | POA: Diagnosis not present

## 2023-12-21 DIAGNOSIS — K769 Liver disease, unspecified: Secondary | ICD-10-CM | POA: Diagnosis present

## 2023-12-21 DIAGNOSIS — E119 Type 2 diabetes mellitus without complications: Secondary | ICD-10-CM | POA: Insufficient documentation

## 2023-12-21 DIAGNOSIS — Z85048 Personal history of other malignant neoplasm of rectum, rectosigmoid junction, and anus: Secondary | ICD-10-CM | POA: Diagnosis present

## 2023-12-21 LAB — CBC WITH DIFFERENTIAL (CANCER CENTER ONLY)
Abs Immature Granulocytes: 0.01 K/uL (ref 0.00–0.07)
Basophils Absolute: 0 K/uL (ref 0.0–0.1)
Basophils Relative: 0 %
Eosinophils Absolute: 0 K/uL (ref 0.0–0.5)
Eosinophils Relative: 0 %
HCT: 43.7 % (ref 39.0–52.0)
Hemoglobin: 13.9 g/dL (ref 13.0–17.0)
Immature Granulocytes: 0 %
Lymphocytes Relative: 39 %
Lymphs Abs: 2.1 K/uL (ref 0.7–4.0)
MCH: 21.1 pg — ABNORMAL LOW (ref 26.0–34.0)
MCHC: 31.8 g/dL (ref 30.0–36.0)
MCV: 66.2 fL — ABNORMAL LOW (ref 80.0–100.0)
Monocytes Absolute: 0.4 K/uL (ref 0.1–1.0)
Monocytes Relative: 7 %
Neutro Abs: 2.9 K/uL (ref 1.7–7.7)
Neutrophils Relative %: 54 %
Platelet Count: 215 K/uL (ref 150–400)
RBC: 6.6 MIL/uL — ABNORMAL HIGH (ref 4.22–5.81)
RDW: 15.5 % (ref 11.5–15.5)
WBC Count: 5.5 K/uL (ref 4.0–10.5)
nRBC: 0 % (ref 0.0–0.2)

## 2023-12-21 LAB — CMP (CANCER CENTER ONLY)
ALT: 35 U/L (ref 0–44)
AST: 25 U/L (ref 15–41)
Albumin: 4.9 g/dL (ref 3.5–5.0)
Alkaline Phosphatase: 72 U/L (ref 38–126)
Anion gap: 12 (ref 5–15)
BUN: 10 mg/dL (ref 6–20)
CO2: 27 mmol/L (ref 22–32)
Calcium: 9.9 mg/dL (ref 8.9–10.3)
Chloride: 102 mmol/L (ref 98–111)
Creatinine: 0.98 mg/dL (ref 0.61–1.24)
GFR, Estimated: >60 mL/min (ref >60)
Glucose, Bld: 132 mg/dL — ABNORMAL HIGH (ref 70–99)
Potassium: 4.2 mmol/L (ref 3.5–5.1)
Sodium: 141 mmol/L (ref 135–145)
Total Bilirubin: 0.4 mg/dL (ref 0.0–1.2)
Total Protein: 7.7 g/dL (ref 6.5–8.1)

## 2023-12-21 MED ORDER — IOHEXOL 9 MG/ML PO SOLN
1000.0000 mL | Freq: Once | ORAL | Status: AC
  Administered 2023-12-21: 1000 mL via ORAL

## 2023-12-21 MED ORDER — IOHEXOL 300 MG/ML  SOLN
100.0000 mL | Freq: Once | INTRAMUSCULAR | Status: AC | PRN
  Administered 2023-12-21: 100 mL via INTRAVENOUS

## 2023-12-23 LAB — CEA (ACCESS): CEA (CHCC): 1.27 ng/mL (ref 0.00–5.00)

## 2023-12-27 NOTE — Assessment & Plan Note (Signed)
 pT2N1bM0, stage IIIA, MMR intact  -Diagnosed in 09/2020. S/p resection on 10/24/20 under Dr. Michaell Cowing, with path showing 3.2 cm invasive adenocarcinoma involving rectosigmoid colon invading into muscularis propria. Two lymph nodes were positive (2/28). Margins were negative. -he received 3 months of CAPOX, given q3weeks, 11/21/20 - 01/23/21. He tolerated well with mild cold sensitivity.  -surveillance CT AP on 12/25/2022 showed NED. -genetic testing negative in 11/2020 -continue cancer surveillance

## 2023-12-28 ENCOUNTER — Other Ambulatory Visit: Payer: Self-pay

## 2023-12-28 ENCOUNTER — Ambulatory Visit: Admitting: Hematology

## 2023-12-28 ENCOUNTER — Inpatient Hospital Stay

## 2023-12-28 ENCOUNTER — Encounter (HOSPITAL_COMMUNITY): Payer: Self-pay

## 2023-12-28 ENCOUNTER — Other Ambulatory Visit

## 2023-12-28 VITALS — BP 161/95 | HR 93 | Temp 98.4°F | Resp 16 | Wt 197.8 lb

## 2023-12-28 DIAGNOSIS — C19 Malignant neoplasm of rectosigmoid junction: Secondary | ICD-10-CM

## 2023-12-28 DIAGNOSIS — K769 Liver disease, unspecified: Secondary | ICD-10-CM

## 2023-12-28 NOTE — Progress Notes (Signed)
 [facility]   Telephone:([phone] Fax:(336) [phone]   Clinic Follow up Note   Patient Care Team: [name], MD as PCP - General (Internal Medicine) [name], MD as Consulting Physician (Colon and Rectal Surgery) [name], MD as Consulting Physician (Gastroenterology) [name], MD as Consulting Physician (Oncology)  Date of Service:  12/28/2023  CHIEF COMPLAINT: f/u of rectosigmoid colon cancer  CURRENT THERAPY:  Cancer surveillance  Oncology History   Cancer of rectosigmoid (colon) (HCC) pT2N1bM0, stage IIIA, MMR intact  -Diagnosed in 09/2020. S/p resection on 10/24/20 under Dr. 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 Two lymph nodes were positive (2/28). Margins were negative. -he received 3 months of CAPOX, given q3weeks, 11/21/20 - 01/23/21. He tolerated well with mild cold sensitivity.  -surveillance CT AP on 12/25/2022 showed NED. 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 No other suspicious intra-abdominal or pelvic findings. Hepatic lesions are concerning for metastatic recurrence, though benign etiologies remain possible. He remains asymptomatic gastrointestinally and CEA is normal. Liver is the most common site for colon cancer recurrence; prior chemotherapy reduces but does not eliminate risk. Possibility of recurrence is being evaluated with further imaging and tissue diagnosis. If recurrence is confirmed, multimodal therapy offers a 20-30% chance of cure. - Reviewed and discussed recent CT and prior imaging findings. - Ordered FDG PET scan to assess  metabolic activity of hepatic lesions and screen for additional metastatic disease. - Ordered ultrasound-guided liver biopsy for tissue diagnosis, pending interventional radiology approval. - Ordered GuardianReveal blood test to assess for early cancer recurrence; nurse to coordinate paperwork and scheduling. - Provided anticipatory guidance regarding potential need for additional chemotherapy, liver resection, ablation, or radiation if recurrence is confirmed. - Discussed follow-up: if all results are negative, he may defer further evaluation until after provider returns from holiday; if positive, he will be seen by another provider during the absence. - Coordinated scheduling for PET scan, biopsy, and blood test; nurse and radiology to contact him with appointments. - Confirmed preference for in-person visit to review results prior to holiday.  Plan - I personally reviewed his surveillance CT scan images with patient and his wife, unfortunately there are 2 new liver lesions in the left lobe, concerning for metastatic recurrence. - Will obtain PET scan for further evaluation - Ultrasound-guided liver biopsy by IR - Guardian reveal today  - Follow-up in office after the above test results return    SUMMARY OF ONCOLOGIC HISTORY: Oncology History  Cancer of rectosigmoid (colon) (HCC)  09/25/2020 Procedure   Colonoscopy, Dr. Rollin  Findings: -An infiltrative and ulcerated non-obstructing large mass was found in the sigmoid colon. The mass was non-circumferential. The mass measured 3 cm in length. Oozing was present. This was biopsied with a cold snare for histology. Areas was tattooed.  -Approximately 17 cm from the anal verge a large mass was identified. There was a central ulceration and large rolled edges. The lesion was nonobstructing and it was less than 50% of the circumference. Multiple cold snare biopsies were obtained. The lesion was tattooed proximally and distally.   09/25/2020 Imaging    CT CAP  IMPRESSION: No definite abnormality is noted in the chest.  7 mm right common iliac lymph node is noted concerning for metastatic disease given the history of newly diagnosed colonic malignancy.  No other abnormality seen in the abdomen or pelvis.   09/25/2020 Pathology Results   FINAL DIAGNOSIS:  A. Colon, sigmoid, mass, biopsy  - INVASIVE COLONIC ADENOCARCINOMA, MODERATELY-DIFFERENTIATED.  Addendum: A. Colon, sigmoid, mass, biopsy  - IMMUNOHISTOCHEMICAL STAINS FOR MLH1, MSH2, MSH6, AND PMS2 ARE INTACT (NORMAL)   10/24/2020 Initial Diagnosis   Cancer of rectosigmoid (colon) (HCC)   10/24/2020 Cancer Staging   Staging form: Colon and Rectum, AJCC 8th Edition - Pathologic stage from 10/24/2020: Stage IIIA (pT2, pN1b, cM0) - Signed by [name], MD on 11/14/2020 Histologic grading system: 4 grade system Histologic grade (G): G2 Residual tumor (R): R0 - None   10/24/2020 Definitive Surgery   -XI ROBOTIC LOW ANTERIOR RESECTION , OMENTAL PEDICLE FLAP WITH OMENTOPEXY, PERFUSION ASSESSMENT USING FIREFLY, TAP BLOCK (Abdomen)  -RIGID PROCTOSCOPY (Rectum)   10/24/2020 Pathology Results   FINAL MICROSCOPIC DIAGNOSIS:   A. RECTOSIGMOID COLON, RESECTION:  - Invasive moderately differentiated adenocarcinoma, 3.2 cm, involving rectosigmoid colon  - Carcinoma invades into muscularis propria  - Metastatic carcinoma to two of twenty-eight lymph nodes (2/28)  - Resection margins are negative for carcinoma  - See oncology table   B. FINAL DISTAL MARGIN, EXCISION:  - Colonic donut, negative for carcinoma   C. PERIAORTIC LYMPH NODE, EXCISION:  - Benign fibroadipose tissue  - Lymphoid tissue is not identified    11/18/2020 Genetic Testing   [facility] Common Cancer Panel is Negative. Report date was 12/16/2020.  The Common Hereditary Cancers + RNA Panel offered by [employer] includes sequencing, deletion/duplication, and RNA testing of the following 47 genes: APC, ATM, AXIN2, BARD1,  BMPR1A, BRCA1, BRCA2, BRIP1, CDH1, CDK4*, CDKN2A (p14ARF)*, CDKN2A (p16INK4a)*, CHEK2, CTNNA1, DICER1, EPCAM (Deletion/duplication testing only), GREM1 (promoter region deletion/duplication testing only), KIT, MEN1, MLH1, MSH2, MSH3, MSH6, MUTYH, NBN, NF1, NHTL1, PALB2, PDGFRA*, PMS2, POLD1, POLE, PTEN, RAD50, RAD51C, RAD51D, SDHB, SDHC, SDHD, SMAD4, SMARCA4. STK11, TP53, TSC1, TSC2, and VHL.  The following genes were evaluated for sequence changes only: SDHA and HOXB13 c.251G>A variant only.  RNA analysis is not performed for the * genes.     03/19/2021 Imaging   EXAM: CT CHEST, ABDOMEN, AND PELVIS WITH CONTRAST  IMPRESSION: 1. 12 mm subtle focus of hypo attenuation in the inferior tip of the right liver. This is may be a focus of subcapsular fat deposition. MRI of the abdomen with and without contrast recommended to further evaluate. 2. 9 mm nodule in the midline omentum. Potentially related to surgery, continued close attention on follow-up recommended. 3. Large stool volume. Imaging features compatible with clinical constipation.   06/19/2021 Imaging   CT AP w contrast (3 mo f/up to 03/2021 CT) IMPRESSION: 1. Status post partial sigmoidectomy without evidence of local recurrence. 2. No convincing evidence of metastatic disease in the abdomen or pelvis. 3. Decreased size of the small omental nodule, favored postsurgical, continued attention on follow-up imaging suggested. 4. Previously identified subtle hepatic lesion is not seen on today's examination and favored benign.   12/22/2021 Imaging    IMPRESSION: 1. Surgical changes of partial sigmoidectomy with rectosigmoid anastomosis common no evidence of local recurrence. 2. No evidence of new or progressive disease in the chest, abdomen, or pelvis. 3. Interval resolution of the tiny omental nodule seen on prior studies.   Rectosigmoid cancer (HCC)  11/14/2020 Initial Diagnosis   Rectosigmoid cancer (HCC)   11/21/2020 - 01/23/2021  Chemotherapy   Patient is on Treatment Plan : COLORECTAL Xelox (Capeox) q21d     03/19/2021 Imaging   EXAM: CT CHEST, ABDOMEN, AND PELVIS WITH CONTRAST  IMPRESSION: 1. 12 mm subtle focus of hypo attenuation in the inferior tip of the right liver. This is may be a focus of subcapsular fat deposition. MRI of the abdomen with and without contrast recommended to further evaluate. 2. 9 mm nodule in the midline omentum. Potentially related to surgery, continued close attention on follow-up recommended. 3. Large stool volume. Imaging features compatible with clinical constipation.      Discussed the use of AI scribe software for clinical note transcription with the patient, who gave verbal consent to proceed.  History of Present Illness Clifford [name] is a 48 year old male with rectosigmoid colon cancer who presents for follow-up after recent imaging revealed new hepatic lesions suspicious for metastatic disease.  He completed chemotherapy in early 2023 and his port has been removed. He denies abdominal pain, nausea, weight loss, hematochezia, melena, lymph node enlargement, or nodules.  Recent CT of the abdomen and pelvis showed two lesions in the left hepatic lobe, one larger and likely new compared with the December 2024 scan that showed no liver lesions. CEA remains within normal limits and there were no other suspicious findings.  He had a brief episode of diarrhea followed by constipation with hard stool, which he associates with dietary intake. He takes Miralax  daily and has no ongoing gastrointestinal symptoms.  He has had right shoulder pain for six months. Imaging showed a partial rotator cuff tear. He received an injection and started physical therapy. Pain is improving but weakness persists, and there has been no recent trauma.     All other systems were reviewed with the patient and are negative.  MEDICAL HISTORY:  Past Medical History:  Diagnosis Date   Colon  cancer (HCC)    Diabetes mellitus without complication (HCC)    Pre-diabetes     SURGICAL HISTORY: Past Surgical History:  Procedure Laterality Date   COLON SURGERY  10/24/20   HERNIA REPAIR     PROCTOSCOPY N/A 10/24/2020   Procedure: RIGID PROCTOSCOPY;  Surgeon: [name], MD;  Location: [facility];  Service: General;  Laterality: N/A;    I have reviewed the social history and family history with the patient and they are unchanged from previous note.  ALLERGIES:  has no known allergies.  MEDICATIONS:  Current Outpatient Medications  Medication Sig Dispense Refill   metFORMIN  (GLUCOPHAGE ) 500 MG tablet Take 500 mg by mouth daily.     No current facility-administered medications for this visit.    PHYSICAL EXAMINATION: ECOG PERFORMANCE STATUS: 0 - Asymptomatic  Vitals:   12/28/23 0937 12/28/23 0940  BP: (!) 162/100 (!) 161/95  Pulse: 80 93  Resp: 16   Temp: 98.4 F (36.9 C)   SpO2: 100% 100%   Wt Readings from Last 3 Encounters:  12/28/23 197 lb 12.8 oz (89.7 kg)  06/28/23 195 lb 1.6 oz (88.5 kg)  12/30/22 196 lb 8 oz (89.1 kg)     GENERAL:alert, no distress and comfortable SKIN: skin color, texture, turgor are normal, no rashes or significant lesions EYES: normal, Conjunctiva are pink and non-injected, sclera clear NECK: supple, thyroid normal size, non-tender, without nodularity LYMPH:  no palpable lymphadenopathy in the cervical, axillary  LUNGS: clear to auscultation and percussion with normal breathing effort HEART: regular rate & rhythm and no murmurs and no lower extremity edema ABDOMEN:abdomen soft, non-tender and  normal bowel sounds Musculoskeletal:no cyanosis of digits and no clubbing  NEURO: alert & oriented x 3 with fluent speech, no focal motor/sensory deficits  Physical Exam    LABORATORY DATA:  I have reviewed the data as listed    Latest Ref Rng & Units 12/21/2023    9:40 AM 06/28/2023    9:42 AM 12/30/2022    9:36 AM  CBC  WBC 4.0 - 10.5  K/uL 5.5  4.6  5.1   Hemoglobin 13.0 - 17.0 g/dL 86.0  86.4  85.9   Hematocrit 39.0 - 52.0 % 43.7  42.3  45.1   Platelets 150 - 400 K/uL 215  201  225         Latest Ref Rng & Units 12/21/2023    9:40 AM 06/28/2023    9:42 AM 12/30/2022    9:36 AM  CMP  Glucose 70 - 99 mg/dL 867  887  98   BUN 6 - 20 mg/dL 10  9  11    Creatinine 0.61 - 1.24 mg/dL 9.01  9.09  9.04   Sodium 135 - 145 mmol/L 141  140  140   Potassium 3.5 - 5.1 mmol/L 4.2  4.1  4.2   Chloride 98 - 111 mmol/L 102  107  105   CO2 22 - 32 mmol/L 27  29  30    Calcium  8.9 - 10.3 mg/dL 9.9  9.5  9.9   Total Protein 6.5 - 8.1 g/dL 7.7  7.1  7.6   Total Bilirubin 0.0 - 1.2 mg/dL 0.4  0.3  0.4   Alkaline Phos 38 - 126 U/L 72  57  57   AST 15 - 41 U/L 25  17  17    ALT 0 - 44 U/L 35  17  15       RADIOGRAPHIC STUDIES: I have personally reviewed the radiological images as listed and agreed with the findings in the report. No results found.    Orders Placed This Encounter  Procedures   NM PET Image Initial (PI) Skull Base To Thigh    Standing Status:   Future    Expected Date:   01/04/2024    Expiration Date:   12/27/2024    If indicated for the ordered procedure, I authorize the administration of a radiopharmaceutical per Radiology protocol:   Yes    Preferred imaging location?:   [facility]   US  BIOPSY (LIVER)    Standing Status:   Future    Expected Date:   01/04/2024    Expiration Date:   12/27/2024    Lab orders requested (DO NOT place separate lab orders, these will be automatically ordered during procedure specimen collection)::   Surgical Pathology    Reason for Exam (SYMPTOM  OR DIAGNOSIS REQUIRED):   new liver lesions, rule out cancer recurrence    Preferred location?:   [facility]   All questions were answered. The patient knows to call the clinic with any problems, questions or concerns. No barriers to learning was detected. The total time spent in the appointment was 40 minutes, including  review of chart and various tests results, discussions about plan of care and coordination of care plan     Onita Mattock, MD 12/28/2023

## 2023-12-28 NOTE — Progress Notes (Signed)
 As per Dr. Lanny, order was placed in portal for Guardant Reveal successfully, paperwork was taken to lab to be drawn on 12/10. All records were uploaded with paperwork.

## 2023-12-28 NOTE — Progress Notes (Signed)
 Clifford [name] POUR, MD  [name] Approved for US  guided core biopsy of LIVER LESION in segment 4b/3.  CT 12/21/23 - image 21 series 2  Moderate sedation  [name]       Previous Messages    ----- Message ----- From: [name] Sent: 12/28/2023  11:28 AM EST To: [name]; [name] Procedure Requests Subject: US  liver biopsy                                Procedure : US  liver biopsy  Reason : new liver lesions, rule out cancer recurrence Dx: Cancer of rectosigmoid (colon) (HCC) [name] (ICD-10-CM)]    History : CT abd pelv w./  Provider : [name], MD  Contact : [phone]

## 2023-12-29 ENCOUNTER — Encounter (HOSPITAL_COMMUNITY)
Admission: RE | Admit: 2023-12-29 | Discharge: 2023-12-29 | Disposition: A | Discharge status: Home / Self Care | Source: Ambulatory Visit | Attending: Hematology | Admitting: Hematology

## 2023-12-29 DIAGNOSIS — K769 Liver disease, unspecified: Secondary | ICD-10-CM

## 2023-12-29 MED ORDER — FLUDEOXYGLUCOSE F - 18 (FDG) INJECTION
10.1800 | Freq: Once | INTRAVENOUS | Status: AC | PRN
  Administered 2023-12-29: 10.18 via INTRAVENOUS

## 2024-01-01 ENCOUNTER — Encounter: Payer: Self-pay | Admitting: Hematology

## 2024-01-02 ENCOUNTER — Other Ambulatory Visit: Payer: Self-pay | Admitting: Hematology

## 2024-01-02 DIAGNOSIS — K769 Liver disease, unspecified: Secondary | ICD-10-CM

## 2024-01-06 ENCOUNTER — Encounter (HOSPITAL_COMMUNITY): Payer: Self-pay | Admitting: Hematology

## 2024-01-07 ENCOUNTER — Ambulatory Visit (HOSPITAL_COMMUNITY)
Admission: RE | Admit: 2024-01-07 | Discharge: 2024-01-07 | Disposition: A | Discharge status: Home / Self Care | Source: Ambulatory Visit | Attending: Hematology | Admitting: Hematology

## 2024-01-07 DIAGNOSIS — K769 Liver disease, unspecified: Secondary | ICD-10-CM | POA: Insufficient documentation

## 2024-01-07 MED ORDER — GADOBUTROL 1 MMOL/ML IV SOLN
9.0000 mL | Freq: Once | INTRAVENOUS | Status: AC | PRN
  Administered 2024-01-07: 9 mL via INTRAVENOUS

## 2024-01-09 ENCOUNTER — Telehealth: Payer: Self-pay | Admitting: Hematology

## 2024-01-09 ENCOUNTER — Inpatient Hospital Stay (HOSPITAL_BASED_OUTPATIENT_CLINIC_OR_DEPARTMENT_OTHER): Admitting: Hematology

## 2024-01-09 DIAGNOSIS — K769 Liver disease, unspecified: Secondary | ICD-10-CM

## 2024-01-09 DIAGNOSIS — C19 Malignant neoplasm of rectosigmoid junction: Secondary | ICD-10-CM

## 2024-01-09 NOTE — H&P (Incomplete)
 "      Chief Complaint: Patient was seen in consultation today for liver lesion.  Referring Physician(s): Feng,Yan  Supervising Physician: Vanice Revel  Patient Status: [facility] - Out-pt  History of Present Illness: Clifford [name] is a 48 y.o. male with a medical history significant for DM and colon cancer initially diagnosed in 2022. He underwent resection 10/24/20 and received three months of systemic therapy. He has been followed with routine surveillance imaging and a CT abdomen/pelvis showed two left hepatic lobe lesions. A PET scan was negative for metastatic disease and the liver lesions did not show any FDG uptake. Further work up including an MRI of the liver confirmed the presence of these lesions with characteristics favoring benign focal nodular hyperplasia. Given his history of colon cancer a liver lesion biopsy was recommended.    Interventional Radiology has been asked to evaluate this patient for an image-guided liver lesion biopsy. Imaging reviewed and procedure approved by Dr. Karalee.   Past Medical History:  Diagnosis Date   Colon cancer (HCC)    Diabetes mellitus without complication (HCC)    Pre-diabetes     Past Surgical History:  Procedure Laterality Date   COLON SURGERY  10/24/20   HERNIA REPAIR     PROCTOSCOPY N/A 10/24/2020   Procedure: RIGID PROCTOSCOPY;  Surgeon: [name], MD;  Location: [facility];  Service: General;  Laterality: N/A;    Allergies: Patient has no known allergies.  Medications: Prior to Admission medications  Medication Sig Start Date End Date Taking? Authorizing Provider  metFORMIN (GLUCOPHAGE) 500 MG tablet Take 500 mg by mouth daily. 10/12/20   [provider]     Family History  Problem Relation Age of Onset   Diabetes Mother    Diabetes Father     Social History   Socioeconomic History   Marital status: Married    Spouse name: Not on file   Number of children: 3   Years of education: Not on file    Highest education level: Not on file  Occupational History   Not on file  Tobacco Use   Smoking status: Never   Smokeless tobacco: Never  Vaping Use   Vaping status: Never Used  Substance and Sexual Activity   Alcohol use: Never   Drug use: Never   Sexual activity: Yes    Birth control/protection: None  Other Topics Concern   Not on file  Social History Narrative   Family originally from [place]   Social Drivers of Health   Tobacco Use: Low Risk (06/28/2023)   Patient History    Smoking Tobacco Use: Never    Smokeless Tobacco Use: Never    Passive Exposure: Not on file  Financial Resource Strain: Not on file  Food Insecurity: No Food Insecurity (12/28/2023)   Epic    Worried About Programme Researcher, Broadcasting/film/video in the Last Year: Never true    Ran Out of Food in the Last Year: Never true  Transportation Needs: No Transportation Needs (12/28/2023)   Epic    Lack of Transportation (Medical): No    Lack of Transportation (Non-Medical): No  Physical Activity: Not on file  Stress: Not on file  Social Connections: Not on file  Depression (EYV7-0): Not on file  Alcohol Screen: Not on file  Housing: Unknown (12/28/2023)   Epic    Unable to Pay for Housing in the Last Year: No    Number of Times Moved in the Last Year: Not on file    Homeless  in the Last Year: No  Utilities: Not on file  Health Literacy: Not on file    Review of Systems: A 12 point ROS discussed and pertinent positives are indicated in the HPI above.  All other systems are negative.  Review of Systems  Vital Signs: There were no vitals taken for this visit.  Physical Exam  Labs:  CBC: Recent Labs    06/28/23 0942 12/21/23 0940  WBC 4.6 5.5  HGB 13.5 13.9  HCT 42.3 43.7  PLT 201 215    COAGS: No results for input(s): INR, APTT in the last 8760 hours.  BMP: Recent Labs    06/28/23 0942 12/21/23 0940  NA 140 141  K 4.1 4.2  CL 107 102  CO2 29 27  GLUCOSE 112* 132*  BUN 9 10  CALCIUM  9.5  9.9  CREATININE 0.90 0.98  GFRNONAA >60 >60    LIVER FUNCTION TESTS: Recent Labs    06/28/23 0942 12/21/23 0940  BILITOT 0.3 0.4  AST 17 25  ALT 17 35  ALKPHOS 57 72  PROT 7.1 7.7  ALBUMIN 4.5 4.9    TUMOR MARKERS: Recent Labs    06/28/23 0942 12/21/23 0940  CEA 1.13 1.27    Assessment and Plan:  History of colon cancer; indeterminate liver lesions: Taner Freda, 48 year old male, presents today to the [facility] Interventional Radiology department for an image-guided liver lesion biopsy.   Risks and benefits of this procedure were discussed with the patient and/or patient's family including, but not limited to bleeding, infection, damage to adjacent structures or low yield requiring additional tests.  All of the questions were answered and there is agreement to proceed. He has been NPO. He does not take any blood-thinning medications.   Consent signed and in chart.  Thank you for this interesting consult.  I greatly enjoyed meeting [employer] and look forward to participating in their care.  A copy of this report was sent to the requesting provider on this date.  Electronically Signed: Warren Dais, AGACNP-BC 01/09/2024, 2:56 PM   I spent a total of  30 Minutes   in face to face in clinical consultation, greater than 50% of which was counseling/coordinating care for liver lesions.  "

## 2024-01-09 NOTE — Progress Notes (Signed)
 " [facility]   [phone] Fax:([phone]   Clinic Follow up Note   Patient Care Team: [name], MD as PCP - General (Internal Medicine) [name], MD as Consulting Physician (Colon and Rectal Surgery) [name], MD as Consulting Physician (Gastroenterology) [name], MD as Consulting Physician (Oncology) 01/09/2024  I connected with Dorn Fabian on 01/09/2024 at  2:00 PM EST by telephone and verified that I am speaking with the correct person using two identifiers.   I discussed the limitations, risks, security and privacy concerns of performing an evaluation and management service by telephone and the availability of in person appointments. I also discussed with the patient that there may be a patient responsible charge related to this service. The patient expressed understanding and agreed to proceed.   Patient's location:  Home  Provider's location:  Office    CHIEF COMPLAINT: f/u PET results    CURRENT THERAPY: Cancer surveillance  Oncology history Cancer of rectosigmoid (colon) (HCC) pT2N1bM0, stage IIIA, MMR intact  -Diagnosed in 09/2020. S/p resection on 10/24/20 under Dr. 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 Two lymph nodes were positive (2/28). Margins were negative. -he received 3 months of CAPOX, given q3weeks, 11/21/20 - 01/23/21. He tolerated well with mild cold sensitivity.  -surveillance CT AP on 12/25/2022 showed NED. -genetic testing negative in 11/2020 -continue cancer surveillance  Assessment & Plan Indeterminate liver lesions Two hepatic lesions (1.7 cm and 0.9 cm) identified on CT. PET scan showed no hypermetabolic activity, and MRI findings favor benign, not suspicious for metastatic disease. Radiology assessment favors benign etiology. He is asymptomatic, privous ctDNA was negative. Overall suspicion for malignancy is low. -  Cancelled scheduled liver biopsy. - Surveillance favored unless circulating tumor DNA (Guardian review) is positive; if positive, reschedule liver biopsy. - Instructed nursing staff to notify him if Guardian review is positive. - Repeat MRI in three months to monitor liver lesions. - Repeat laboratory studies, including Guardian review, one week prior to next MRI. - Scheduled follow-up in three months. - If Guardian review is negative, continue surveillance.  Plan - I personally reviewed his liver MRI images and discussed the findings with the him, likely benign. - Cancel his scheduled liver biopsy tomorrow - Will follow-up with his GardantReveal results, if (+), then we will proceed with liver biopsy - Follow-up in 3 months with lab and repeated liver MRI with and without contrast   SUMMARY OF ONCOLOGIC HISTORY: Oncology History  Cancer of rectosigmoid (colon) (HCC)  09/25/2020 Procedure   Colonoscopy, Dr. Rollin  Findings: -An infiltrative and ulcerated non-obstructing large mass was found in the sigmoid colon. The mass was non-circumferential. The mass measured 3 cm in length. Oozing was present. This was biopsied with a cold snare for histology. Areas was tattooed.  -Approximately 17 cm from the anal verge a large mass was identified. There was a central ulceration and large rolled edges. The lesion was nonobstructing and it was less than 50% of the circumference. Multiple cold snare biopsies were obtained. The lesion was tattooed proximally and distally.   09/25/2020 Imaging   CT CAP  IMPRESSION: No definite abnormality is noted in the chest.  7 mm right common iliac lymph node is noted concerning for metastatic disease given the history of newly diagnosed colonic malignancy.  No other abnormality seen in the abdomen or pelvis.   09/25/2020 Pathology Results   FINAL DIAGNOSIS:  A. Colon, sigmoid, mass,  biopsy  - INVASIVE COLONIC ADENOCARCINOMA,  MODERATELY-DIFFERENTIATED.  Addendum: A. Colon, sigmoid, mass, biopsy  - IMMUNOHISTOCHEMICAL STAINS FOR MLH1, MSH2, MSH6, AND PMS2 ARE INTACT (NORMAL)   10/24/2020 Initial Diagnosis   Cancer of rectosigmoid (colon) (HCC)   10/24/2020 Cancer Staging   Staging form: Colon and Rectum, AJCC 8th Edition - Pathologic stage from 10/24/2020: Stage IIIA (pT2, pN1b, cM0) - Signed by [name], MD on 11/14/2020 Histologic grading system: 4 grade system Histologic grade (G): G2 Residual tumor (R): R0 - None   10/24/2020 Definitive Surgery   -XI ROBOTIC LOW ANTERIOR RESECTION , OMENTAL PEDICLE FLAP WITH OMENTOPEXY, PERFUSION ASSESSMENT USING FIREFLY, TAP BLOCK (Abdomen)  -RIGID PROCTOSCOPY (Rectum)   10/24/2020 Pathology Results   FINAL MICROSCOPIC DIAGNOSIS:   A. RECTOSIGMOID COLON, RESECTION:  - Invasive moderately differentiated adenocarcinoma, 3.2 cm, involving rectosigmoid colon  - Carcinoma invades into muscularis propria  - Metastatic carcinoma to two of twenty-eight lymph nodes (2/28)  - Resection margins are negative for carcinoma  - See oncology table   B. FINAL DISTAL MARGIN, EXCISION:  - Colonic donut, negative for carcinoma   C. PERIAORTIC LYMPH NODE, EXCISION:  - Benign fibroadipose tissue  - Lymphoid tissue is not identified    11/18/2020 Genetic Testing   [employer] Common Cancer Panel is Negative. Report date was 12/16/2020.  The Common Hereditary Cancers + RNA Panel offered by [employer] includes sequencing, deletion/duplication, and RNA testing of the following 47 genes: APC, ATM, AXIN2, BARD1, BMPR1A, BRCA1, BRCA2, BRIP1, CDH1, CDK4*, CDKN2A (p14ARF)*, CDKN2A (p16INK4a)*, CHEK2, CTNNA1, DICER1, EPCAM (Deletion/duplication testing only), GREM1 (promoter region deletion/duplication testing only), KIT, MEN1, MLH1, MSH2, MSH3, MSH6, MUTYH, NBN, NF1, NHTL1, PALB2, PDGFRA*, PMS2, POLD1, POLE, PTEN, RAD50, RAD51C, RAD51D, SDHB, SDHC, SDHD, SMAD4, SMARCA4. STK11, TP53, TSC1, TSC2, and VHL.   The following genes were evaluated for sequence changes only: SDHA and HOXB13 c.251G>A variant only.  RNA analysis is not performed for the * genes.     03/19/2021 Imaging   EXAM: CT CHEST, ABDOMEN, AND PELVIS WITH CONTRAST  IMPRESSION: 1. 12 mm subtle focus of hypo attenuation in the inferior tip of the right liver. This is may be a focus of subcapsular fat deposition. MRI of the abdomen with and without contrast recommended to further evaluate. 2. 9 mm nodule in the midline omentum. Potentially related to surgery, continued close attention on follow-up recommended. 3. Large stool volume. Imaging features compatible with clinical constipation.   06/19/2021 Imaging   CT AP w contrast (3 mo f/up to 03/2021 CT) IMPRESSION: 1. Status post partial sigmoidectomy without evidence of local recurrence. 2. No convincing evidence of metastatic disease in the abdomen or pelvis. 3. Decreased size of the small omental nodule, favored postsurgical, continued attention on follow-up imaging suggested. 4. Previously identified subtle hepatic lesion is not seen on today's examination and favored benign.   12/22/2021 Imaging    IMPRESSION: 1. Surgical changes of partial sigmoidectomy with rectosigmoid anastomosis common no evidence of local recurrence. 2. No evidence of new or progressive disease in the chest, abdomen, or pelvis. 3. Interval resolution of the tiny omental nodule seen on prior studies.   Rectosigmoid cancer (HCC)  11/14/2020 Initial Diagnosis   Rectosigmoid cancer (HCC)   11/21/2020 - 01/23/2021 Chemotherapy   Patient is on Treatment Plan : COLORECTAL Xelox (Capeox) q21d     03/19/2021 Imaging   EXAM: CT CHEST, ABDOMEN, AND PELVIS WITH CONTRAST  IMPRESSION: 1. 12 mm subtle focus of hypo attenuation in the inferior tip of the  right liver. This is may be a focus of subcapsular fat deposition. MRI of the abdomen with and without contrast recommended to further evaluate. 2. 9 mm nodule  in the midline omentum. Potentially related to surgery, continued close attention on follow-up recommended. 3. Large stool volume. Imaging features compatible with clinical constipation.     Discussed the use of AI scribe software for clinical note transcription with the patient, who gave verbal consent to proceed.  History of Present Illness Clifford [name] is a 48 year old male who presents for follow-up evaluation of indeterminate liver lesions.  He was last seen two weeks ago and has since undergone PET scan, CT scan, and MRI to further evaluate two liver lesions measuring 1.7 cm and 0.9 cm. The PET scan showed no hypermetabolic activity, and MRI findings were not consistent with metastatic disease.  He is currently asymptomatic without pain, abdominal discomfort, or new complaints and feels well overall.  He is awaiting circulating tumor DNA testing results (Guardian review), which are still pending.     REVIEW OF SYSTEMS:   Constitutional: Denies fevers, chills or abnormal weight loss Eyes: Denies blurriness of vision Ears, nose, mouth, throat, and face: Denies mucositis or sore throat Respiratory: Denies cough, dyspnea or wheezes Cardiovascular: Denies palpitation, chest discomfort or lower extremity swelling Gastrointestinal:  Denies nausea, heartburn or change in bowel habits Skin: Denies abnormal skin rashes Lymphatics: Denies new lymphadenopathy or easy bruising Neurological:Denies numbness, tingling or new weaknesses Behavioral/Psych: Mood is stable, no new changes  All other systems were reviewed with the patient and are negative.  MEDICAL HISTORY:  Past Medical History:  Diagnosis Date   Colon cancer (HCC)    Diabetes mellitus without complication (HCC)    Pre-diabetes     SURGICAL HISTORY: Past Surgical History:  Procedure Laterality Date   COLON SURGERY  10/24/20   HERNIA REPAIR     PROCTOSCOPY N/A 10/24/2020   Procedure: RIGID PROCTOSCOPY;   Surgeon: [name], MD;  Location: [facility];  Service: General;  Laterality: N/A;    I have reviewed the social history and family history with the patient and they are unchanged from previous note.  ALLERGIES:  has no known allergies.  MEDICATIONS:  Current Outpatient Medications  Medication Sig Dispense Refill   metFORMIN (GLUCOPHAGE) 500 MG tablet Take 500 mg by mouth daily.     No current facility-administered medications for this visit.    PHYSICAL EXAMINATION: Not performed   LABORATORY DATA:  I have reviewed the data as listed    Latest Ref Rng & Units 12/21/2023    9:40 AM 06/28/2023    9:42 AM 12/30/2022    9:36 AM  CBC  WBC 4.0 - 10.5 K/uL 5.5  4.6  5.1   Hemoglobin 13.0 - 17.0 g/dL 86.0  86.4  85.9   Hematocrit 39.0 - 52.0 % 43.7  42.3  45.1   Platelets 150 - 400 K/uL 215  201  225         Latest Ref Rng & Units 12/21/2023    9:40 AM 06/28/2023    9:42 AM 12/30/2022    9:36 AM  CMP  Glucose 70 - 99 mg/dL 867  887  98   BUN 6 - 20 mg/dL 10  9  11    Creatinine 0.61 - 1.24 mg/dL 9.01  9.09  9.04   Sodium 135 - 145 mmol/L 141  140  140   Potassium 3.5 - 5.1 mmol/L 4.2  4.1  4.2  Chloride 98 - 111 mmol/L 102  107  105   CO2 22 - 32 mmol/L 27  29  30    Calcium  8.9 - 10.3 mg/dL 9.9  9.5  9.9   Total Protein 6.5 - 8.1 g/dL 7.7  7.1  7.6   Total Bilirubin 0.0 - 1.2 mg/dL 0.4  0.3  0.4   Alkaline Phos 38 - 126 U/L 72  57  57   AST 15 - 41 U/L 25  17  17    ALT 0 - 44 U/L 35  17  15       RADIOGRAPHIC STUDIES: I have personally reviewed the radiological images as listed and agreed with the findings in the report. No results found.     I discussed the assessment and treatment plan with the patient. The patient was provided an opportunity to ask questions and all were answered. The patient agreed with the plan and demonstrated an understanding of the instructions.   The patient was advised to call back or seek an in-person evaluation if the symptoms worsen  or if the condition fails to improve as anticipated.  I provided 25 minutes of non face-to-face telephone visit time during this encounter, including review of chart and various tests results, discussions about plan of care and coordination of care plan.    Onita Mattock, MD 01/09/2024     "

## 2024-01-10 ENCOUNTER — Ambulatory Visit

## 2024-01-10 ENCOUNTER — Other Ambulatory Visit: Payer: Self-pay

## 2024-01-10 ENCOUNTER — Encounter: Payer: Self-pay | Admitting: Hematology

## 2024-01-10 LAB — GUARDANT REVEAL

## 2024-01-11 NOTE — Assessment & Plan Note (Signed)
 pT2N1bM0, stage IIIA, MMR intact  -Diagnosed in 09/2020. S/p resection on 10/24/20 under Dr. Michaell Cowing, with path showing 3.2 cm invasive adenocarcinoma involving rectosigmoid colon invading into muscularis propria. Two lymph nodes were positive (2/28). Margins were negative. -he received 3 months of CAPOX, given q3weeks, 11/21/20 - 01/23/21. He tolerated well with mild cold sensitivity.  -surveillance CT AP on 12/25/2022 showed NED. -genetic testing negative in 11/2020 -continue cancer surveillance

## 2024-01-16 ENCOUNTER — Telehealth: Payer: Self-pay

## 2024-01-16 NOTE — Telephone Encounter (Signed)
 Spoke with pt via telephone to inform pt that the Guardant 360 results resulted but the Guardant Reveal results have not.  Stated this nurse checked the [employer] portal online and the Guardant Reveal is still in progress.  Stated once it has resulted, this nurse will give the pt a call and the next steps based off those results.

## 2024-01-17 ENCOUNTER — Encounter: Payer: Self-pay | Admitting: Hematology

## 2024-01-18 ENCOUNTER — Ambulatory Visit (HOSPITAL_COMMUNITY)

## 2024-01-23 NOTE — Progress Notes (Signed)
 Per email sent to this RN and Dr Lanny from [facility] Reveal, a Guardant Reveal cannot be done for this pt since the pt is not actively receiving chemo per Email.  Glenys Potters with [employer] will stop by to discuss what test can be done instead.

## 2024-01-25 ENCOUNTER — Encounter (HOSPITAL_COMMUNITY): Payer: Self-pay

## 2024-01-25 ENCOUNTER — Other Ambulatory Visit: Payer: Self-pay

## 2024-01-25 DIAGNOSIS — C19 Malignant neoplasm of rectosigmoid junction: Secondary | ICD-10-CM

## 2024-01-25 DIAGNOSIS — K769 Liver disease, unspecified: Secondary | ICD-10-CM

## 2024-01-25 NOTE — Progress Notes (Signed)
 Clifford [name] POUR, MD  Clifford [name] Approved for US  CORE BX of LIVER LESION.  Inferior segment 3 abutting segment 4b.  MRI image 32 series 11  Moderate sedation  [name]       Previous Messages    ----- Message ----- From: Clifford [name] Sent: 01/25/2024   1:09 PM EST To: Clifford [name]; Clifford [name] Procedure Requests Subject: US  liver bx                                    Procedure : US  liver biopsy  Reason : Liver Lesions found on recent MRI & PET Scan Dx: Liver disease [K76.9 (ICD-10-CM)]; Cancer of rectosigmoid (colon) (HCC) Clifford [name] (ICD-10-CM)]; Rectosigmoid cancer (HCC) Clifford [name] (ICD-10-CM)]    History : MR liver w/wp , NM PET Skull base to thigh , CT abd pelv w/  Provider : [name], MD  Contact : [phone]

## 2024-01-25 NOTE — Progress Notes (Signed)
 US  Liver Biopsy order placed based on Staff message sent to this nurse from Dr Lanny.  Pt is aware of liver bx order placed.  Notified biopsy team regarding scheduling liver biopsy.

## 2024-01-26 ENCOUNTER — Other Ambulatory Visit: Payer: Self-pay

## 2024-01-27 ENCOUNTER — Other Ambulatory Visit (HOSPITAL_COMMUNITY)

## 2024-02-08 ENCOUNTER — Other Ambulatory Visit (HOSPITAL_COMMUNITY): Payer: Self-pay | Admitting: Radiology

## 2024-02-08 DIAGNOSIS — K769 Liver disease, unspecified: Secondary | ICD-10-CM

## 2024-02-09 ENCOUNTER — Ambulatory Visit (HOSPITAL_COMMUNITY)
Admission: RE | Admit: 2024-02-09 | Discharge: 2024-02-09 | Disposition: A | Discharge status: Home / Self Care | Source: Ambulatory Visit | Attending: Hematology | Admitting: Hematology

## 2024-02-09 ENCOUNTER — Encounter (HOSPITAL_COMMUNITY): Payer: Self-pay

## 2024-02-09 ENCOUNTER — Other Ambulatory Visit: Payer: Self-pay

## 2024-02-09 DIAGNOSIS — K769 Liver disease, unspecified: Secondary | ICD-10-CM | POA: Diagnosis present

## 2024-02-09 DIAGNOSIS — C19 Malignant neoplasm of rectosigmoid junction: Secondary | ICD-10-CM

## 2024-02-09 DIAGNOSIS — C189 Malignant neoplasm of colon, unspecified: Secondary | ICD-10-CM | POA: Diagnosis not present

## 2024-02-09 DIAGNOSIS — R160 Hepatomegaly, not elsewhere classified: Secondary | ICD-10-CM | POA: Diagnosis not present

## 2024-02-09 DIAGNOSIS — Z7984 Long term (current) use of oral hypoglycemic drugs: Secondary | ICD-10-CM | POA: Insufficient documentation

## 2024-02-09 DIAGNOSIS — E119 Type 2 diabetes mellitus without complications: Secondary | ICD-10-CM | POA: Insufficient documentation

## 2024-02-09 LAB — CBC
HCT: 44.2 % (ref 39.0–52.0)
Hemoglobin: 14.2 g/dL (ref 13.0–17.0)
MCH: 21.7 pg — ABNORMAL LOW (ref 26.0–34.0)
MCHC: 32.1 g/dL (ref 30.0–36.0)
MCV: 67.5 fL — ABNORMAL LOW (ref 80.0–100.0)
Platelets: 227 K/uL (ref 150–400)
RBC: 6.55 MIL/uL — ABNORMAL HIGH (ref 4.22–5.81)
RDW: 15.5 % (ref 11.5–15.5)
WBC: 4.4 K/uL (ref 4.0–10.5)
nRBC: 0 % (ref 0.0–0.2)

## 2024-02-09 LAB — GLUCOSE, CAPILLARY: Glucose-Capillary: 67 mg/dL — ABNORMAL LOW (ref 70–99)

## 2024-02-09 LAB — PROTIME-INR
INR: 1 (ref 0.8–1.2)
Prothrombin Time: 13.6 s (ref 11.4–15.2)

## 2024-02-09 MED ORDER — SULFUR HEXAFLUORIDE MICROSPH 60.7-25 MG IJ SUSR
INTRAMUSCULAR | Status: AC
  Filled 2024-02-09: qty 5

## 2024-02-09 MED ORDER — MIDAZOLAM HCL (PF) 2 MG/2ML IJ SOLN
INTRAMUSCULAR | Status: AC | PRN
  Administered 2024-02-09 (×2): 1 mg via INTRAVENOUS

## 2024-02-09 MED ORDER — LIDOCAINE-EPINEPHRINE (PF) 2 %-1:200000 IJ SOLN
INTRAMUSCULAR | Status: AC
  Filled 2024-02-09: qty 20

## 2024-02-09 MED ORDER — GELATIN ABSORBABLE 12-7 MM EX MISC
CUTANEOUS | Status: AC
  Filled 2024-02-09: qty 1

## 2024-02-09 MED ORDER — FENTANYL CITRATE (PF) 100 MCG/2ML IJ SOLN
INTRAMUSCULAR | Status: AC | PRN
  Administered 2024-02-09 (×2): 50 ug via INTRAVENOUS

## 2024-02-09 MED ORDER — FENTANYL CITRATE (PF) 100 MCG/2ML IJ SOLN
INTRAMUSCULAR | Status: AC
  Filled 2024-02-09: qty 2

## 2024-02-09 MED ORDER — MIDAZOLAM HCL 2 MG/2ML IJ SOLN
INTRAMUSCULAR | Status: AC
  Filled 2024-02-09: qty 2

## 2024-02-09 MED ORDER — SODIUM CHLORIDE 0.9 % IV SOLN: INTRAVENOUS | Status: DC

## 2024-02-09 NOTE — Procedures (Signed)
 Interventional Radiology Procedure Note  Procedure: Ultrasound guided right lobe liver mass biopsy   Findings: Please refer to procedural dictation for full description. 18 ga core x2.  Gelfoam slurry needle track embolization.  Complications: None immediate  Estimated Blood Loss: < 5 ml  Recommendations: Strict 3 hour bedrest. Follow Pathology results.   Ester Sides, MD

## 2024-02-09 NOTE — Discharge Instructions (Signed)
 Please call Interventional Radiology clinic [phone] with any questions or concerns.   You may remove your dressing and shower tomorrow.   Liver Biopsy, Care After After a liver biopsy, it is common to have these things in the area where the biopsy was done. You may: Have pain. Feel sore. Have bruising. You may also feel tired for a few days. Follow these instructions at home: Medicines Take over-the-counter and prescription medicines only as told by your doctor. If you were prescribed an antibiotic medicine, take it as told by your doctor. Do not stop taking the antibiotic, even if you start to feel better. Do not take medicines that may thin your blood. These medicines include aspirin and ibuprofen. Take them only if your doctor tells you to. If told, take steps to prevent problems with pooping (constipation). You may need to: Drink enough fluid to keep your pee (urine) pale yellow. Take medicines. You will be told what medicines to take. Eat foods that are high in fiber. These include beans, whole grains, and fresh fruits and vegetables. Limit foods that are high in fat and sugar. These include fried or sweet foods. Ask your doctor if you should avoid driving or using machines while you are taking your medicine. Caring for your incision Follow instructions from your doctor about how to take care of your cut from surgery (incisions). Make sure you: Wash your hands with soap and water for at least 20 seconds before and after you change your bandage. If you cannot use soap and water, use hand sanitizer. Change your bandage. Leavestitches or skin glue in place for at least two weeks. Leave tape strips alone unless you are told to take them off. You may trim the edges of the tape strips if they curl up. Check your incision every day for signs of infection. Check for: Redness, swelling, or more pain. Fluid or blood. Warmth. Pus or a bad smell. Do not take baths, swim, or use a  hot tub. Ask your doctor about taking showers or sponge baths. Activity Rest at home for 1-2 days, or as told by your doctor. Get up to take short walks every 1 to 2 hours. Ask for help if you feel weak or unsteady. Do not lift anything that is heavier than 10 lb (4.5 kg), or the limit that you are told. Do not play contact sports for 2 weeks after the procedure. Return to your normal activities as told by your doctor. Ask what activities are safe for you. General instructions  Do not drink alcohol in the first week after the procedure. Plan to have a responsible adult care for you for the time you are told after you leave the hospital or clinic. This is important. It is up to you to get the results of your procedure. Ask how to get your results when they are ready. Keep all follow-up visits.   Contact a doctor if: You have more bleeding in your incision. Your incision swells, or is red and more painful. You have fluid that comes from your incision. You develop a rash. You have fever or chills. Get help right away if: You have swelling, bloating, or pain in your belly (abdomen). You get dizzy or faint. You vomit or you feel like vomiting. You have trouble breathing or feel short of breath. You have chest pain. You have problems talking or seeing. You have trouble with your balance or moving your arms or legs. These symptoms may be an emergency. Get help  right away. Call your local emergency services (911 in the [place] Do not wait to see if the symptoms will go away. Do not drive yourself to the hospital. Summary After the procedure, it is common to have pain, soreness, bruising, and tiredness. Your doctor will tell you how to take care of yourself at home. Change your bandage, take your medicines, and limit your activities as told by your doctor. Call your doctor if you have symptoms of infection. Get help right away if your belly swells, your cut bleeds a lot, or you have trouble  talking or breathing. This information is not intended to replace advice given to you by your health care provider. Make sure you discuss any questions you have with your healthcare provider. Document Revised: 11/19/2019 Document Reviewed: 11/19/2019 Elsevier Patient Education  2022 [employer] Inc[employer]     Moderate Conscious Sedation, Adult, Care After This sheet gives you information about how to care for yourself after your procedure. Your health care provider may also give you more specific instructions. If you have problems or questions, contact your health care provider. What can I expect after the procedure? After the procedure, it is common to have: Sleepiness for several hours. Impaired judgment for several hours. Difficulty with balance. Vomiting if you eat too soon. Follow these instructions at home: For the time period you were told by your health care provider: Rest. Do not participate in activities where you could fall or become injured. Do not drive or use machinery. Do not drink alcohol. Do not take sleeping pills or medicines that cause drowsiness. Do not make important decisions or sign legal documents. Do not take care of children on your own.        Eating and drinking Follow the diet recommended by your health care provider. Drink enough fluid to keep your urine pale yellow. If you vomit: Drink water, juice, or soup when you can drink without vomiting. Make sure you have little or no nausea before eating solid foods.    General instructions Take over-the-counter and prescription medicines only as told by your health care provider. Have a responsible adult stay with you for the time you are told. It is important to have someone help care for you until you are awake and alert. Do not smoke. Keep all follow-up visits as told by your health care provider. This is important. Contact a health care provider if: You are still sleepy or having trouble with balance after  24 hours. You feel light-headed. You keep feeling nauseous or you keep vomiting. You develop a rash. You have a fever. You have redness or swelling around the IV site. Get help right away if: You have trouble breathing. You have new-onset confusion at home. Summary After the procedure, it is common to feel sleepy, have impaired judgment, or feel nauseous if you eat too soon. Rest after you get home. Know the things you should not do after the procedure. Follow the diet recommended by your health care provider and drink enough fluid to keep your urine pale yellow. Get help right away if you have trouble breathing or new-onset confusion at home. This information is not intended to replace advice given to you by your health care provider. Make sure you discuss any questions you have with your health care provider. Document Revised: 05/04/2019 Document Reviewed: 11/30/2018 Elsevier Patient Education  2021 [employer].

## 2024-02-09 NOTE — H&P (Signed)
 "   Chief Complaint: Liver lesion  Referring Provider(s): Feng,Yan   Supervising Physician: Jennefer Rover  Patient Status: [facility] - Out-pt  History of Present Illness: Clifford [name] is a 49 y.o. male with past medical history significant for diabetes and colon cancer with most recent PET scan showing 2 liver lesions measuring 1.7 cm and 0.9 cm.  He is followed by Dr. Lanny who is requested liver lesion biopsy for further workup.  Case approved by Dr. Karalee for ultrasound-guided core biopsy of liver lesion in segment 4b/3.  Confirms NPO since MN and ride/supervision (wife) available for 24 hours.  Does not wear CPAP or use supplemental home O2.  Allergies Reviewed:  Patient has no known allergies.   Patient is Full Code  Past Medical History:  Diagnosis Date   Colon cancer (HCC)    Diabetes mellitus without complication (HCC)    Pre-diabetes     Past Surgical History:  Procedure Laterality Date   COLON SURGERY  10/24/20   HERNIA REPAIR     PROCTOSCOPY N/A 10/24/2020   Procedure: RIGID PROCTOSCOPY;  Surgeon: [name], MD;  Location: [facility];  Service: General;  Laterality: N/A;      Medications: Prior to Admission medications  Medication Sig Start Date End Date Taking? Authorizing Provider  metFORMIN (GLUCOPHAGE) 500 MG tablet Take 500 mg by mouth daily. 10/12/20   [provider]     Family History  Problem Relation Age of Onset   Diabetes Mother    Diabetes Father     Social History   Socioeconomic History   Marital status: Married    Spouse name: Not on file   Number of children: 3   Years of education: Not on file   Highest education level: Not on file  Occupational History   Not on file  Tobacco Use   Smoking status: Never   Smokeless tobacco: Never  Vaping Use   Vaping status: Never Used  Substance and Sexual Activity   Alcohol use: Never   Drug use: Never   Sexual activity: Yes    Birth control/protection: None  Other  Topics Concern   Not on file  Social History Narrative   Family originally from [place]   Social Drivers of Health   Tobacco Use: Low Risk (02/09/2024)   Patient History    Smoking Tobacco Use: Never    Smokeless Tobacco Use: Never    Passive Exposure: Not on file  Financial Resource Strain: Not on file  Food Insecurity: No Food Insecurity (12/28/2023)   Epic    Worried About Programme Researcher, Broadcasting/film/video in the Last Year: Never true    Ran Out of Food in the Last Year: Never true  Transportation Needs: No Transportation Needs (12/28/2023)   Epic    Lack of Transportation (Medical): No    Lack of Transportation (Non-Medical): No  Physical Activity: Not on file  Stress: Not on file  Social Connections: Not on file  Depression (EYV7-0): Not on file  Alcohol Screen: Not on file  Housing: Unknown (12/28/2023)   Epic    Unable to Pay for Housing in the Last Year: No    Number of Times Moved in the Last Year: Not on file    Homeless in the Last Year: No  Utilities: Not on file  Health Literacy: Not on file     Review of Systems: A 12 point ROS discussed and pertinent positives are indicated in the HPI above.  All other systems are  negative.  Review of Systems  Constitutional:  Negative for chills, fatigue and fever.  HENT:  Negative for sore throat.   Respiratory:  Negative for shortness of breath.   Cardiovascular:  Negative for chest pain and leg swelling.  Gastrointestinal:  Negative for abdominal pain, blood in stool, nausea and vomiting.  Genitourinary:  Negative for hematuria.  Skin:  Negative for rash and wound.  Hematological:  Does not bruise/bleed easily.    Vital Signs: BP (!) 156/87 (BP Location: Right Arm)   Pulse 67   Temp 98.4 F (36.9 C) (Oral)   Resp 16   Ht 5' 7.5 (1.715 m)   Wt 187 lb (84.8 kg)   SpO2 98%   BMI 28.86 kg/m     Physical Exam Constitutional:      Appearance: He is normal weight.  HENT:     Mouth/Throat:     Mouth: Mucous membranes  are moist.     Pharynx: Oropharynx is clear.  Cardiovascular:     Rate and Rhythm: Normal rate and regular rhythm.     Heart sounds: Normal heart sounds.  Pulmonary:     Effort: Pulmonary effort is normal.     Breath sounds: Normal breath sounds.  Abdominal:     General: There is no distension.     Palpations: Abdomen is soft.     Tenderness: There is no abdominal tenderness. There is no guarding.  Skin:    General: Skin is warm and dry.     Comments: No rash or wound over planned puncture location   Neurological:     Mental Status: He is alert and oriented to person, place, and time.  Psychiatric:        Mood and Affect: Mood normal.        Behavior: Behavior normal.        Thought Content: Thought content normal.        Judgment: Judgment normal.     Imaging: No results found.  Labs:  CBC: Recent Labs    06/28/23 0942 12/21/23 0940 02/09/24 1145  WBC 4.6 5.5 4.4  HGB 13.5 13.9 14.2  HCT 42.3 43.7 44.2  PLT 201 215 227    COAGS: No results for input(s): INR, APTT in the last 8760 hours.  BMP: Recent Labs    06/28/23 0942 12/21/23 0940  NA 140 141  K 4.1 4.2  CL 107 102  CO2 29 27  GLUCOSE 112* 132*  BUN 9 10  CALCIUM  9.5 9.9  CREATININE 0.90 0.98  GFRNONAA >60 >60    LIVER FUNCTION TESTS: Recent Labs    06/28/23 0942 12/21/23 0940  BILITOT 0.3 0.4  AST 17 25  ALT 17 35  ALKPHOS 57 72  PROT 7.1 7.7  ALBUMIN 4.5 4.9    TUMOR MARKERS: Recent Labs    06/28/23 0942 12/21/23 0940  CEA 1.13 1.27    Assessment and Plan:  Patient presents today for image guided liver lesion biopsy with Dr. Jennefer. No contraindications for procedure identified in ROS, physical exam, or review of pre-sedation considerations. CBC and INR in process at time of note, will be reviewed prior to procedure.  01/07/2024 MR liver imaging available and reviewed: Faintly T2 hyperintense, arterially hyperenhancing lesions in the left lobe of the liver, in the  inferior left lobe, hepatic segment III measuring 1.7 x 1.6 cm and in the superior left lobe of the liver, hepatic segment II measuring 0.9 cm. VSS, afebrile Patient does not take a  blood thinner  Abx not indicated    Risks and benefits of image guided liver lesion biopsy was discussed with the patient and/or patient's family including, but not limited to bleeding, infection, damage to adjacent structures or low yield requiring additional tests.  All of the questions were answered and there is agreement to proceed.  Consent signed and in chart.   Thank you for allowing our service to participate in Clifford [name] 's care.    Electronically Signed: Laymon Coast, NP   02/09/2024, 12:31 PM     I spent a total of  15 Minutes   in face to face in clinical consultation, greater than 50% of which was counseling/coordinating care for image guided liver lesion biopsy.   (A copy of this note was sent to the referring provider and the time of visit.)  "

## 2024-02-10 ENCOUNTER — Other Ambulatory Visit: Payer: Self-pay

## 2024-02-13 LAB — SURGICAL PATHOLOGY

## 2024-02-14 NOTE — Assessment & Plan Note (Signed)
 pT2N1bM0, stage IIIA, MMR intact  -Diagnosed in 09/2020. S/p resection on 10/24/20 under Dr. 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 Two lymph nodes were positive (2/28). Margins were negative. -he received 3 months of CAPOX, given q3weeks, 11/21/20 - 01/23/21. He tolerated well with mild cold sensitivity.  -surveillance CT AP on 12/25/2022 showed NED. -genetic testing negative in 11/2020 -Surveillance image including CT and MRI showed a 1.7 cm lesion in the liver, negative on PET, biopsy was benign. -continue cancer surveillance

## 2024-02-15 ENCOUNTER — Inpatient Hospital Stay: Attending: Hematology | Admitting: Hematology

## 2024-02-15 DIAGNOSIS — Z85048 Personal history of other malignant neoplasm of rectum, rectosigmoid junction, and anus: Secondary | ICD-10-CM | POA: Insufficient documentation

## 2024-02-15 DIAGNOSIS — C19 Malignant neoplasm of rectosigmoid junction: Secondary | ICD-10-CM | POA: Diagnosis not present

## 2024-02-15 DIAGNOSIS — K769 Liver disease, unspecified: Secondary | ICD-10-CM | POA: Diagnosis present

## 2024-02-15 NOTE — Progress Notes (Signed)
 " [facility]   Telephone:([phone] Fax:([phone]) [phone]   Clinic Follow up Note   Patient Care Team: [name], MD as PCP - General (Internal Medicine) [name], MD as Consulting Physician (Colon and Rectal Surgery) [name], MD as Consulting Physician (Gastroenterology) [name], MD as Consulting Physician (Oncology) 02/15/2024  I connected with Clifford [name] on 02/15/24 at  3:40 PM EST by telephone and verified that I am speaking with the correct person using two identifiers.   I discussed the limitations, risks, security and privacy concerns of performing an evaluation and management service by telephone and the availability of in person appointments. I also discussed with the patient that there may be a patient responsible charge related to this service. The patient expressed understanding and agreed to proceed.   Patient's location:  Home  Provider's location:  Office    CHIEF COMPLAINT: Follow-up colon cancer   CURRENT THERAPY: Cancer surveillance  Oncology history Cancer of rectosigmoid (colon) (HCC) pT2N1bM0, stage IIIA, MMR intact  -Diagnosed in 09/2020. S/p resection on 10/24/20 under Dr. 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 Two lymph nodes were positive (2/28). Margins were negative. -he received 3 months of CAPOX, given q3weeks, 11/21/20 - 01/23/21. He tolerated well with mild cold sensitivity.  -surveillance CT AP on 12/25/2022 showed NED. -genetic testing negative in 11/2020 -Surveillance image including CT and MRI showed a 1.7 cm lesion in the liver, negative on PET, biopsy was benign. -continue cancer surveillance  Assessment & Plan Stage IIIA rectosigmoid colon cancer, under surveillance Three years post-diagnosis, under active surveillance. No evidence of recurrence on recent liver lesion biopsy, PET scan, or [employer] ctDNA testing. Surveillance  continues due to prior liver findings, but all recent evaluations have been negative. - Continue surveillance for recurrence. - Order laboratory testing in March, including [employer] ctDNA to monitor for minimal residual disease. - Contact [employer] to resolve prior authorization and ensure appropriate testing. - Schedule follow-up in four to five months (June) after next CT scan. - Instruct him to report symptoms such as abdominal pain, bloating, anorexia, or weight loss.  Benign liver lesion, under observation Liver lesion identified on prior imaging with negative biopsy for malignancy and no evidence of adenoma, hemangioma, or other pathology. PET scan showed no increased uptake. Lesion likely benign, possibly focal fatty change or benign enhancement pattern. Continued observation warranted due to small possibility of biopsy sampling error. - Order repeat CT scan of the liver in six months to monitor for stability or resolution. - If CT scan shows stability or resolution and Signatera remains negative, discontinue further imaging and surveillance for the liver lesion.  Plan - Liver biopsy results reviewed, which is benign. - Continue cancer surveillance, he will return in March for lab including Signatera - Follow-up in 6 months with a repeated CT and lab.   SUMMARY OF ONCOLOGIC HISTORY: Oncology History  Cancer of rectosigmoid (colon) (HCC)  09/25/2020 Procedure   Colonoscopy, Dr. Rollin  Findings: -An infiltrative and ulcerated non-obstructing large mass was found in the sigmoid colon. The mass was non-circumferential. The mass measured 3 cm in length. Oozing was present. This was biopsied with a cold snare for histology. Areas was tattooed.  -Approximately 17 cm from the anal verge a large mass was identified. There was a central ulceration and large rolled edges. The lesion was nonobstructing and it was less than 50% of the circumference. Multiple cold snare biopsies were obtained.  The  lesion was tattooed proximally and distally.   09/25/2020 Imaging   CT CAP  IMPRESSION: No definite abnormality is noted in the chest.  7 mm right common iliac lymph node is noted concerning for metastatic disease given the history of newly diagnosed colonic malignancy.  No other abnormality seen in the abdomen or pelvis.   09/25/2020 Pathology Results   FINAL DIAGNOSIS:  A. Colon, sigmoid, mass, biopsy  - INVASIVE COLONIC ADENOCARCINOMA, MODERATELY-DIFFERENTIATED.  Addendum: A. Colon, sigmoid, mass, biopsy  - IMMUNOHISTOCHEMICAL STAINS FOR MLH1, MSH2, MSH6, AND PMS2 ARE INTACT (NORMAL)   10/24/2020 Initial Diagnosis   Cancer of rectosigmoid (colon) (HCC)   10/24/2020 Cancer Staging   Staging form: Colon and Rectum, AJCC 8th Edition - Pathologic stage from 10/24/2020: Stage IIIA (pT2, pN1b, cM0) - Signed by [name], MD on 11/14/2020 Histologic grading system: 4 grade system Histologic grade (G): G2 Residual tumor (R): R0 - None   10/24/2020 Definitive Surgery   -XI ROBOTIC LOW ANTERIOR RESECTION , OMENTAL PEDICLE FLAP WITH OMENTOPEXY, PERFUSION ASSESSMENT USING FIREFLY, TAP BLOCK (Abdomen)  -RIGID PROCTOSCOPY (Rectum)   10/24/2020 Pathology Results   FINAL MICROSCOPIC DIAGNOSIS:   A. RECTOSIGMOID COLON, RESECTION:  - Invasive moderately differentiated adenocarcinoma, 3.2 cm, involving rectosigmoid colon  - Carcinoma invades into muscularis propria  - Metastatic carcinoma to two of twenty-eight lymph nodes (2/28)  - Resection margins are negative for carcinoma  - See oncology table   B. FINAL DISTAL MARGIN, EXCISION:  - Colonic donut, negative for carcinoma   C. PERIAORTIC LYMPH NODE, EXCISION:  - Benign fibroadipose tissue  - Lymphoid tissue is not identified    11/18/2020 Genetic Testing   [facility] Common Cancer Panel is Negative. Report date was 12/16/2020.  The Common Hereditary Cancers + RNA Panel offered by [facility] includes sequencing, deletion/duplication, and RNA  testing of the following 47 genes: APC, ATM, AXIN2, BARD1, BMPR1A, BRCA1, BRCA2, BRIP1, CDH1, CDK4*, CDKN2A (p14ARF)*, CDKN2A (p16INK4a)*, CHEK2, CTNNA1, DICER1, EPCAM (Deletion/duplication testing only), GREM1 (promoter region deletion/duplication testing only), KIT, MEN1, MLH1, MSH2, MSH3, MSH6, MUTYH, NBN, NF1, NHTL1, PALB2, PDGFRA*, PMS2, POLD1, POLE, PTEN, RAD50, RAD51C, RAD51D, SDHB, SDHC, SDHD, SMAD4, SMARCA4. STK11, TP53, TSC1, TSC2, and VHL.  The following genes were evaluated for sequence changes only: SDHA and HOXB13 c.251G>A variant only.  RNA analysis is not performed for the * genes.     03/19/2021 Imaging   EXAM: CT CHEST, ABDOMEN, AND PELVIS WITH CONTRAST  IMPRESSION: 1. 12 mm subtle focus of hypo attenuation in the inferior tip of the right liver. This is may be a focus of subcapsular fat deposition. MRI of the abdomen with and without contrast recommended to further evaluate. 2. 9 mm nodule in the midline omentum. Potentially related to surgery, continued close attention on follow-up recommended. 3. Large stool volume. Imaging features compatible with clinical constipation.   06/19/2021 Imaging   CT AP w contrast (3 mo f/up to 03/2021 CT) IMPRESSION: 1. Status post partial sigmoidectomy without evidence of local recurrence. 2. No convincing evidence of metastatic disease in the abdomen or pelvis. 3. Decreased size of the small omental nodule, favored postsurgical, continued attention on follow-up imaging suggested. 4. Previously identified subtle hepatic lesion is not seen on today's examination and favored benign.   12/22/2021 Imaging    IMPRESSION: 1. Surgical changes of partial sigmoidectomy with rectosigmoid anastomosis common no evidence of local recurrence. 2. No evidence of new or progressive disease in the chest, abdomen, or pelvis. 3. Interval resolution of the  tiny omental nodule seen on prior studies.   Rectosigmoid cancer (HCC)  11/14/2020 Initial Diagnosis    Rectosigmoid cancer (HCC)   11/21/2020 - 01/23/2021 Chemotherapy   Patient is on Treatment Plan : COLORECTAL Xelox (Capeox) q21d     03/19/2021 Imaging   EXAM: CT CHEST, ABDOMEN, AND PELVIS WITH CONTRAST  IMPRESSION: 1. 12 mm subtle focus of hypo attenuation in the inferior tip of the right liver. This is may be a focus of subcapsular fat deposition. MRI of the abdomen with and without contrast recommended to further evaluate. 2. 9 mm nodule in the midline omentum. Potentially related to surgery, continued close attention on follow-up recommended. 3. Large stool volume. Imaging features compatible with clinical constipation.     Discussed the use of AI scribe software for clinical note transcription with the patient, who gave verbal consent to proceed.  History of Present Illness Adonias Hubert Raatz is a 49 year old male with stage IIIA rectosigmoid colon cancer who presents for oncology surveillance.  He was diagnosed with stage IIIA rectosigmoid colon cancer in 2022 and is under surveillance. A liver lesion identified on prior imaging was biopsied and showed no malignancy or specific benign pathology. He remains concerned about this lesion.  He has not received chemotherapy and has never had a port. Recent PET scan showed no increased uptake in the liver lesion. A [employer] ctDNA test in December was negative, though insurance denied coverage for this test and this was discussed.  He has no abdominal pain, bloating, poor appetite, weight loss, or other new or concerning symptoms.     REVIEW OF SYSTEMS:   Constitutional: Denies fevers, chills or abnormal weight loss Eyes: Denies blurriness of vision Ears, nose, mouth, throat, and face: Denies mucositis or sore throat Respiratory: Denies cough, dyspnea or wheezes Cardiovascular: Denies palpitation, chest discomfort or lower extremity swelling Gastrointestinal:  Denies nausea, heartburn or change in bowel habits Skin:  Denies abnormal skin rashes Lymphatics: Denies new lymphadenopathy or easy bruising Neurological:Denies numbness, tingling or new weaknesses Behavioral/Psych: Mood is stable, no new changes  All other systems were reviewed with the patient and are negative.  MEDICAL HISTORY:  Past Medical History:  Diagnosis Date   Colon cancer (HCC)    Diabetes mellitus without complication (HCC)    Pre-diabetes     SURGICAL HISTORY: Past Surgical History:  Procedure Laterality Date   COLON SURGERY  10/24/20   HERNIA REPAIR     PROCTOSCOPY N/A 10/24/2020   Procedure: RIGID PROCTOSCOPY;  Surgeon: [name], MD;  Location: [facility];  Service: General;  Laterality: N/A;    I have reviewed the social history and family history with the patient and they are unchanged from previous note.  ALLERGIES:  has no known allergies.  MEDICATIONS:  Current Outpatient Medications  Medication Sig Dispense Refill   metFORMIN (GLUCOPHAGE) 500 MG tablet Take 500 mg by mouth daily.     No current facility-administered medications for this visit.    PHYSICAL EXAMINATION: Not performed   LABORATORY DATA:  I have reviewed the data as listed    Latest Ref Rng & Units 02/09/2024   11:45 AM 12/21/2023    9:40 AM 06/28/2023    9:42 AM  CBC  WBC 4.0 - 10.5 K/uL 4.4  5.5  4.6   Hemoglobin 13.0 - 17.0 g/dL 85.7  86.0  86.4   Hematocrit 39.0 - 52.0 % 44.2  43.7  42.3   Platelets 150 - 400 K/uL 227  215  201  Latest Ref Rng & Units 12/21/2023    9:40 AM 06/28/2023    9:42 AM 12/30/2022    9:36 AM  CMP  Glucose 70 - 99 mg/dL 867  887  98   BUN 6 - 20 mg/dL 10  9  11    Creatinine 0.61 - 1.24 mg/dL 9.01  9.09  9.04   Sodium 135 - 145 mmol/L 141  140  140   Potassium 3.5 - 5.1 mmol/L 4.2  4.1  4.2   Chloride 98 - 111 mmol/L 102  107  105   CO2 22 - 32 mmol/L 27  29  30    Calcium  8.9 - 10.3 mg/dL 9.9  9.5  9.9   Total Protein 6.5 - 8.1 g/dL 7.7  7.1  7.6   Total Bilirubin 0.0 - 1.2 mg/dL 0.4  0.3   0.4   Alkaline Phos 38 - 126 U/L 72  57  57   AST 15 - 41 U/L 25  17  17    ALT 0 - 44 U/L 35  17  15       RADIOGRAPHIC STUDIES: I have personally reviewed the radiological images as listed and agreed with the findings in the report. No results found.     I discussed the assessment and treatment plan with the patient. The patient was provided an opportunity to ask questions and all were answered. The patient agreed with the plan and demonstrated an understanding of the instructions.   The patient was advised to call back or seek an in-person evaluation if the symptoms worsen or if the condition fails to improve as anticipated.  I provided 15 minutes of non face-to-face telephone visit time during this encounter, including review of chart and various tests results, discussions about plan of care and coordination of care plan.    Onita Mattock, MD 02/15/24     "

## 2024-02-23 ENCOUNTER — Other Ambulatory Visit: Payer: Self-pay

## 2024-02-27 ENCOUNTER — Inpatient Hospital Stay

## 2024-02-27 ENCOUNTER — Inpatient Hospital Stay: Admitting: Nurse Practitioner

## 2024-04-02 ENCOUNTER — Inpatient Hospital Stay

## 2024-04-02 ENCOUNTER — Other Ambulatory Visit (HOSPITAL_COMMUNITY)

## 2024-04-09 ENCOUNTER — Inpatient Hospital Stay: Admitting: Hematology

## 2024-04-16 ENCOUNTER — Inpatient Hospital Stay: Attending: Hematology

## 2024-08-13 ENCOUNTER — Inpatient Hospital Stay: Attending: Hematology

## 2024-08-20 ENCOUNTER — Inpatient Hospital Stay: Attending: Hematology | Admitting: Hematology
